# Patient Record
Sex: Male | Born: 1960 | Race: White | Hispanic: No | Marital: Married | Smoking: Never smoker
Health system: Southern US, Community
[De-identification: ages and names within clinical notes are randomized; demographics above are authoritative.]

---

## 2009-01-31 ENCOUNTER — Ambulatory Visit: Payer: Self-pay | Admitting: Sports Medicine

## 2009-01-31 DIAGNOSIS — M216X9 Other acquired deformities of unspecified foot: Secondary | ICD-10-CM

## 2009-01-31 DIAGNOSIS — M7750 Other enthesopathy of unspecified foot: Secondary | ICD-10-CM | POA: Insufficient documentation

## 2009-04-17 ENCOUNTER — Ambulatory Visit: Payer: Self-pay | Admitting: Sports Medicine

## 2009-04-17 DIAGNOSIS — M79609 Pain in unspecified limb: Secondary | ICD-10-CM

## 2009-11-20 ENCOUNTER — Ambulatory Visit: Payer: Self-pay | Admitting: Sports Medicine

## 2009-11-20 DIAGNOSIS — M25569 Pain in unspecified knee: Secondary | ICD-10-CM

## 2011-03-06 ENCOUNTER — Ambulatory Visit (INDEPENDENT_AMBULATORY_CARE_PROVIDER_SITE_OTHER): Payer: Managed Care, Other (non HMO) | Admitting: Sports Medicine

## 2011-03-06 ENCOUNTER — Encounter: Payer: Self-pay | Admitting: Sports Medicine

## 2011-03-06 VITALS — BP 98/66 | Ht 72.5 in | Wt 161.0 lb

## 2011-03-06 DIAGNOSIS — M25569 Pain in unspecified knee: Secondary | ICD-10-CM

## 2011-03-06 NOTE — Progress Notes (Signed)
  Subjective:    Patient ID: Nicholas Rhodes, male    DOB: 07/24/1961, 50 y.o.   MRN: [id number]  Knee Pain    50 yo M runner here for evaluation of Lt knee pain.  Ran marathon 3 days ago, last few miles began having some medial pain that felt mostly like a pressure.  Had similar pain when running a long run 2 weeks ago at the end of the run only, always able to finish runs as well as his marathon.  Denies awkward step or fall, did not feel pop, twist, or other mechanical symptoms.  No swelling.  He did put the green insoles for a couple of his longer runs and felt better.  Has prior orthotics, but not using anymore. Has h/o proximal tibial stress fx on Rt side, really wants to make sure not developing one on the Lt side. No night time pain.  Able to walk normally today without pain.  Really no pain at all today.   Review of Systems negative    Objective:   Physical Exam Gen: NAD Leg lengths: normal Feet: b/l mild pes cavus with broad forefoot.  Mild trans arch breakdown with some curling of 4/5th toes b/l.  MIld Morton's foot b/l.  No abnl callus.  Nl post tib strength. Lt knee: FROM, no effusion.  No joint line ttp.  No condylar ttp.  He points to area of pain at proximal tibial epicondyle region.  Neg patellar apprehension, grind, and inhibition.  Ligaments intact.  Neg McMurray's and Thessalys.  Quad and pat tendon intact.  No ttp over ITB/LFC.  Palpable but nontender medial Plica. Hips: FROM Negative hop test Gait: good form, mild out kick on Lt foot. Mid foot striker.  Level pelvis and hips.  No pronation.  MSK [place] Lt knee/tibia:  Mild cortical irregularity of proximal tibia on long and transv views with minimal periosteal doppler signal.  No gross cortical break or edema.  Med meniscus intact.  No effusion in suprapatellar pouch.       Assessment & Plan:

## 2011-03-06 NOTE — Assessment & Plan Note (Addendum)
This appears to be from increased impact, as he is fatiguing at the end of his runs likely leading poorer running form  - quad strengthening exercises - recommended trying his custom orthotics again, if uncomfortable he can RTC and we can modify - also recommend more cushion oriented shoes for his longer runs over lighter and more flexible shoes - reassured him at this time he has no evidence of true stress fracture, meniscal tear, etc. - f/u prn  Spent > 25 minutes with patient, >50% spent counseling on diagnosis, prognosis, and treatment as well as performing ultrasound

## 2012-01-01 ENCOUNTER — Encounter: Payer: Self-pay | Admitting: Sports Medicine

## 2012-01-01 ENCOUNTER — Ambulatory Visit (INDEPENDENT_AMBULATORY_CARE_PROVIDER_SITE_OTHER): Payer: Managed Care, Other (non HMO) | Admitting: Sports Medicine

## 2012-01-01 VITALS — BP 116/72 | HR 51 | Ht 72.0 in | Wt 160.0 lb

## 2012-01-01 DIAGNOSIS — M7710 Lateral epicondylitis, unspecified elbow: Secondary | ICD-10-CM

## 2012-01-01 DIAGNOSIS — M25529 Pain in unspecified elbow: Secondary | ICD-10-CM

## 2012-01-01 MED ORDER — AMITRIPTYLINE HCL 10 MG PO TABS: 10.0000 mg | ORAL_TABLET | Freq: Every day | ORAL | Status: DC

## 2012-01-01 MED ORDER — KETOPROFEN POWD: Status: DC

## 2012-01-01 NOTE — Assessment & Plan Note (Addendum)
Pain for nearly two months, consistent with lateral epicondylitis.  However, pain more severe than expected without tendon tear, concern for some nerve irritation.  Advised no ice because of concern for nerve pain. Also Rx for amitriptyline qhs because of this concern.

## 2012-01-01 NOTE — Assessment & Plan Note (Addendum)
Rx for ketoprofen powder, home exercises given.  Also fitted for elbow sleeve.  Follow up in 1 month.   This injury is suspicious for some nerve irritation because of the pain he experiences at nighttime. For this reason we will use amitriptyline at night along with the standard treatments for tennis elbow.

## 2012-01-01 NOTE — Patient Instructions (Signed)
Please use the ketoprofen gel on your elbow four times a day.  Also, try taking amitriptyline at bed time to help calm down nerve pain.  Start doing the home exercises in the attached sheet.  Start with one lb weights, please try to do 3 sets of 15 and increase weight as tolerated.

## 2012-01-01 NOTE — Progress Notes (Signed)
  Subjective:    Patient ID: Nicholas Rhodes, male    DOB: 12/23/1960, 51 y.o.   MRN: [id number]  HPI  Nicholas Rhodes comes in for right elbow pain.  He says it started the day after Thanksgiving when he chopped some wood and then stacked it into piles.  He says about a week after that he was using a hammer and found it was extremely painful.  It has gotten worse, and now it hurts him at night and sometimes keeps him up.  He says he has had tennis elbow before, but never this bad.  He has even moved his mouse to the left side (he is right handed) because it hurts so badly.    Review of Systems Pertinent Items noted in HPI.     Objective:   Physical Exam BP 116/72  Pulse 51  Ht 6' (1.829 m)  Wt 160 lb (72.576 kg)  BMI 21.70 kg/m2 General appearance: alert, cooperative and no distress Right Elbow: tenderness to palpation over lateral epicondyle Pain with wrist flexion, pain with supination No swelling or abnormality  MSK [place]: Left common epicondyle tendon in tact.  There is hypoechogenic area around lateral epicondyle.  There is a small avulsion on the lateral epicondyle. No abnormal Doppler flow.       Assessment & Plan:

## 2013-04-08 ENCOUNTER — Encounter: Payer: Self-pay | Admitting: Sports Medicine

## 2013-04-08 ENCOUNTER — Ambulatory Visit (INDEPENDENT_AMBULATORY_CARE_PROVIDER_SITE_OTHER): Payer: Managed Care, Other (non HMO) | Admitting: Sports Medicine

## 2013-04-08 VITALS — BP 111/64 | HR 55 | Ht 72.0 in | Wt 160.0 lb

## 2013-04-08 DIAGNOSIS — M7750 Other enthesopathy of unspecified foot: Secondary | ICD-10-CM

## 2013-04-08 DIAGNOSIS — M79609 Pain in unspecified limb: Secondary | ICD-10-CM

## 2013-04-08 DIAGNOSIS — M21619 Bunion of unspecified foot: Secondary | ICD-10-CM

## 2013-04-08 DIAGNOSIS — M2011 Hallux valgus (acquired), right foot: Secondary | ICD-10-CM

## 2013-04-08 NOTE — Progress Notes (Signed)
  Subjective:    Patient ID: Nicholas Rhodes, male    DOB: 11-13-1961, 52 y.o.   MRN: [id number]  HPI  Pt presents to clinic for evaluation of rt forefoot pain between 2-3 toes for 3 months.  Feels like a buldge when it is inflamed.  Long time runner. Running in [employer]a shoes, this feels ok. Feels uncomfortable in dress shoes, or shoes with thinner sole.     Review of Systems     Objective:   Physical Exam  Rt foot: Puffiness distal to MT heads 3-4  Transverse arch wider than lt Early bunion change Splaying between toes 2-3 bunionette on rt  Rt forefoot 12 cm wide Lt forefoot 11 cm wide  Slight bunionette on lt      Assessment & Plan:

## 2013-04-13 DIAGNOSIS — M21619 Bunion of unspecified foot: Secondary | ICD-10-CM | POA: Insufficient documentation

## 2013-04-13 NOTE — Assessment & Plan Note (Signed)
Now with more breakdown on RT  He ahs bilateral forefoot changes  Work with MT padding and see if this will help  RT with old custom orthotics and let's see if we can correct these

## 2013-04-13 NOTE — Assessment & Plan Note (Signed)
New MT pads added Position corrected  Patient had good relief of pain

## 2013-04-14 ENCOUNTER — Ambulatory Visit: Payer: Managed Care, Other (non HMO) | Admitting: Sports Medicine

## 2015-01-24 ENCOUNTER — Encounter: Payer: Self-pay | Admitting: Sports Medicine

## 2015-01-24 ENCOUNTER — Ambulatory Visit
Admission: RE | Admit: 2015-01-24 | Discharge: 2015-01-24 | Disposition: A | Discharge status: Home / Self Care | Payer: BLUE CROSS/BLUE SHIELD | Source: Ambulatory Visit | Attending: Sports Medicine | Admitting: Sports Medicine

## 2015-01-24 ENCOUNTER — Ambulatory Visit (INDEPENDENT_AMBULATORY_CARE_PROVIDER_SITE_OTHER): Payer: BLUE CROSS/BLUE SHIELD | Admitting: Sports Medicine

## 2015-01-24 VITALS — BP 129/70 | HR 71 | Ht 72.0 in | Wt 165.0 lb

## 2015-01-24 DIAGNOSIS — M869 Osteomyelitis, unspecified: Secondary | ICD-10-CM | POA: Diagnosis not present

## 2015-01-24 NOTE — Patient Instructions (Addendum)
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  Hold off on long runs for now. We are getting an xray to further assess your pelvis. Take aleve 220mg  tablets - take 2 tablets twice daily with food for 10 days. Do exercises discussed.    Do 3 sets of 30 without discomfort prior to adding 5 lb weight (slowly build up on the weight). Ease back into running after 2 weeks of doing these exercises. Follow up in 6 weeks.
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
## 2015-03-07 ENCOUNTER — Ambulatory Visit (INDEPENDENT_AMBULATORY_CARE_PROVIDER_SITE_OTHER): Payer: BLUE CROSS/BLUE SHIELD | Admitting: Sports Medicine

## 2015-03-07 ENCOUNTER — Encounter: Payer: Self-pay | Admitting: Sports Medicine

## 2015-03-07 VITALS — BP 115/68 | Ht 72.0 in | Wt 165.0 lb

## 2015-03-07 DIAGNOSIS — M25551 Pain in right hip: Secondary | ICD-10-CM

## 2015-03-07 NOTE — Progress Notes (Signed)
   HPI:  F/u groin pain - had xrays which showed small chip at lateral superior edge of acetabulum. Has been biking and swimming. Doing low mileage runs (15 mi/week total, no more than 6 mi at at time). He now has no pain, but feels tightness on the inside of each thigh after running. He has not been icing or doing any more stretching or yoga. He briefly did the exercises given to him at his last visit. He's taken Aleve as needed with some relief. [place] and [place]  ROS: See HPI  PMFSH: hx multiple occasional MSK issues otherwise healthy  PHYSICAL EXAM: BP 115/68 mmHg  Ht 6' (1.829 m)  Wt 165 lb (74.844 kg)  BMI 22.37 kg/m2 Gen: NAD, pleasant, cooperative MSK:  Full range of motion with internal and external rotation bilateral hips. Full strength with hip abductors bilaterally. Right hip adductor is weak, also left hip adductor is slightly weak. Full strength with hip flexion bilaterally.  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 Rather, seemed to have pulled a slight chip off of his acetabulum. Also injured the lateral greater trochanter. He's doing well. We've given him exercises for strengthening his hip adductors. These include inside lifts, outside lifts, and standing rotations. He will add 1-2 miles to one per week to gradually increase his mileage. F/u prn.     FOLLOW UP: F/u as needed if symptoms worsen or do not improve.   SIGNED: Estevan RyderBrittany J. Pollie MeyerMcIntyre, MD Family Medicine Resident PGY-3 [facility]   Agree with assessment.  Sterling BigKB Fields, MD

## 2015-03-07 NOTE — Patient Instructions (Signed)
Add 1-2 miles at a time to one run per week Do inside lifts, outside lifts, standing hip rotation Do these until your strength feels better then do them 3 times per week
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
## 2015-04-18 ENCOUNTER — Encounter: Payer: Self-pay | Admitting: Sports Medicine

## 2015-04-18 ENCOUNTER — Ambulatory Visit (INDEPENDENT_AMBULATORY_CARE_PROVIDER_SITE_OTHER): Payer: BLUE CROSS/BLUE SHIELD | Admitting: Sports Medicine

## 2015-04-18 VITALS — BP 86/32 | Ht 72.0 in | Wt 165.0 lb

## 2015-04-18 DIAGNOSIS — M869 Osteomyelitis, unspecified: Secondary | ICD-10-CM

## 2015-04-18 DIAGNOSIS — M6580 Other synovitis and tenosynovitis, unspecified site: Secondary | ICD-10-CM

## 2015-04-18 DIAGNOSIS — M76899 Other specified enthesopathies of unspecified lower limb, excluding foot: Secondary | ICD-10-CM | POA: Insufficient documentation

## 2015-04-18 MED ORDER — NITROGLYCERIN 0.2 MG/HR TD PT24: MEDICATED_PATCH | TRANSDERMAL | Status: DC

## 2015-04-18 NOTE — Patient Instructions (Signed)
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
## 2015-04-18 NOTE — Assessment & Plan Note (Signed)
I advised him that one of the biggest issues with this takes a long time to heal  We will continue to work with hip range of motion and strength exercises  He can continue sports that do not aggravate this

## 2015-04-25 ENCOUNTER — Ambulatory Visit: Payer: BLUE CROSS/BLUE SHIELD | Admitting: Sports Medicine

## 2015-06-20 ENCOUNTER — Encounter: Payer: Self-pay | Admitting: Sports Medicine

## 2015-06-20 ENCOUNTER — Ambulatory Visit (INDEPENDENT_AMBULATORY_CARE_PROVIDER_SITE_OTHER): Payer: BLUE CROSS/BLUE SHIELD | Admitting: Sports Medicine

## 2015-06-20 VITALS — BP 134/66 | Ht 72.0 in | Wt 166.2 lb

## 2015-06-20 DIAGNOSIS — M6580 Other synovitis and tenosynovitis, unspecified site: Secondary | ICD-10-CM

## 2015-06-20 DIAGNOSIS — M869 Osteomyelitis, unspecified: Secondary | ICD-10-CM

## 2015-06-20 DIAGNOSIS — M76899 Other specified enthesopathies of unspecified lower limb, excluding foot: Secondary | ICD-10-CM

## 2015-06-20 NOTE — Assessment & Plan Note (Signed)
I advised him that one of the biggest issues with this takes a long time to heal.  Will start isolated PT to stabilize pelvis and work on adductors.  He can continue sports that do not aggravate this.  If pain not improved by September, consider steroid injection at pubis before iron man end of September
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
## 2015-06-20 NOTE — Assessment & Plan Note (Addendum)
D/C topical nitroglycerin as it does not seem to be improving symptoms and is causing headaches.  Continue using body helix with exercise.  Will start isolated PT to stabilize pelvis and work on adductors. Continue home exercise regimen.  Likely compensation from osteitis pubis.

## 2015-07-07 ENCOUNTER — Ambulatory Visit: Payer: BLUE CROSS/BLUE SHIELD | Attending: Sports Medicine | Admitting: Physical Therapy

## 2015-07-07 DIAGNOSIS — M6289 Other specified disorders of muscle: Secondary | ICD-10-CM | POA: Diagnosis present

## 2015-07-07 DIAGNOSIS — R29898 Other symptoms and signs involving the musculoskeletal system: Secondary | ICD-10-CM

## 2015-07-07 DIAGNOSIS — R1031 Right lower quadrant pain: Secondary | ICD-10-CM

## 2015-07-07 DIAGNOSIS — R293 Abnormal posture: Secondary | ICD-10-CM

## 2015-07-07 DIAGNOSIS — R1032 Left lower quadrant pain: Secondary | ICD-10-CM | POA: Diagnosis present
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
## 2015-07-11 ENCOUNTER — Ambulatory Visit: Payer: BLUE CROSS/BLUE SHIELD | Admitting: Physical Therapy

## 2015-07-11 DIAGNOSIS — R29898 Other symptoms and signs involving the musculoskeletal system: Secondary | ICD-10-CM

## 2015-07-11 DIAGNOSIS — R293 Abnormal posture: Secondary | ICD-10-CM

## 2015-07-11 DIAGNOSIS — R1032 Left lower quadrant pain: Secondary | ICD-10-CM

## 2015-07-11 DIAGNOSIS — M6289 Other specified disorders of muscle: Secondary | ICD-10-CM | POA: Diagnosis not present

## 2015-07-11 DIAGNOSIS — R1031 Right lower quadrant pain: Secondary | ICD-10-CM

## 2015-07-12 NOTE — Therapy (Signed)
[facility] [address] [place], [place], [address] Phone: [phone]   Fax:  [phone]  Physical Therapy Treatment  Patient Details  Name: Nicholas Rhodes MRN: [id number] Date of Birth: [birth year] Referring Provider:  Elias Else, MD  Encounter Date: 07/11/2015      PT End of Session - 07/11/15 1611    Visit Number 2   Number of Visits 12   Date for PT Re-Evaluation 08/31/15   PT Start Time 1550   PT Stop Time 1630   PT Time Calculation (min) 40 min   Activity Tolerance Patient tolerated treatment well   Behavior During Therapy [facility] for tasks assessed/performed      No past medical history on file.  No past surgical history on file.  There were no vitals filed for this visit.  Visit Diagnosis:  Weakness of both hips  Abnormal posture  Pain in the groin, left  Pain in the groin, right      Subjective Assessment - 07/11/15 1615    Subjective No c/o pain today.  He is fatigued from a 80 mile bike ride this weekend.  Did not do the exercises.     Currently in Pain? No/denies            [facility] Adult PT Treatment/Exercise - 07/11/15 1553    Exercises   Exercises Lumbar;Knee/Hip;Other Exercises   Other Exercises  seated piriformis stretch each leg 30 second hold   Lumbar Exercises: Stretches   Active Hamstring Stretch 1 rep;30 seconds   Hip Flexor Stretch 1 rep;60 seconds;Other (comment)   Hip Flexor Stretch Limitations completed in supine in thomas test position, L   Pelvic Tilt 5 reps;10 seconds   ITB Stretch 1 rep;30 seconds   Lumbar Exercises: Supine   Bridge 5 reps;5 seconds;Other (comment)  w/ ball squeezes between knees x5, SL bridge x10/leg   Bridge Limitations constant verbal cues needed for correct form and body alignment  cues for articulating spine, releasing hip flexors.    Lumbar Exercises: Sidelying   Clam 20 reps   Hip Abduction 15 reps   Other Sidelying Lumbar Exercises hip  adduction x15reps   Knee/Hip Exercises: Standing   Hip Abduction AROM;Stengthening;Both;1 set;10 reps;Knee straight                PT Education - 07/11/15 1634    Education provided Yes   Education Details HEP form and body alignment and awareness in space   Person(s) Educated Patient   Methods Explanation;Tactile cues;Verbal cues   Comprehension Returned demonstration          PT Short Term Goals - 07/11/15 1612    PT SHORT TERM GOAL #1   Title Patient will be I with initial HEP   Time 2   Status On-going   PT SHORT TERM GOAL #2   Title Pt will be able to demo glute/hip ext strength to 4+/5    Time 4   Status On-going   PT SHORT TERM GOAL #3   Title Pt will be able to to walk uphill without increased pain    Time 4   Status On-going   PT SHORT TERM GOAL #4   Title Pt will be able to run up to 10 miles with only min fatigue/pulling in ant hip/groin   Time 4   Status On-going           PT Long Term Goals - 07/11/15 1613    PT LONG TERM GOAL #  1   Title Pt will be I with more advanced HEP and be consistent.    Time 8   Period Weeks   Status On-going   PT LONG TERM GOAL #2   Title Pt will complete training for Iron Man and report min tightness/pain after running    Status On-going   PT LONG TERM GOAL #3   Title Patient will be able to perform double leg lower exercise to 45 deg and maintain lumbar/pelvic neutral (head up if needed)   Status On-going   PT LONG TERM GOAL #4   Title Pt will demo 5/5 hamstring, glute med and abd/adductor strength bilaterally for optimal running mechanics   Status On-going               Plan - 07/12/15 9563    Clinical Impression Statement Patient tolerated ex well, no increased pain. Apparent weakness in hip abd and hip adduction with sidelying mat ex.  No Trendelenburg sign. Patient needs thoracic/hip extension exercises to reverse biking posture but with core control.      PT Next Visit Plan review HEP and  progress, may not be seen until after his 1/2 Iron man.  Begin pilates.    PT Home Exercise Plan given HEP: single leg bridge, hip flex stretch, lower ab table top, hip add and glute med   Consulted and Agree with Plan of Care Patient        Problem List Patient Active Problem List   Diagnosis Date Noted  . Adductor tendinitis 04/18/2015  . 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 Bunion of great toe 04/13/2013  . Elbow pain 01/01/2012  . Lateral epicondylitis (tennis elbow) 01/01/2012  . KNEE PAIN, LEFT 11/20/2009  . FOOT PAIN, BILATERAL 04/17/2009  . METATARSALGIA 01/31/2009  . 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 [place], [place], [address] Phone: [phone]   Fax:  [phone]    Karie Mainland, PT 07/12/2015 8:29 AM Phone: [phone] Fax: [phone]

## 2015-07-25 ENCOUNTER — Ambulatory Visit: Payer: BLUE CROSS/BLUE SHIELD | Attending: Sports Medicine | Admitting: Physical Therapy

## 2015-07-25 DIAGNOSIS — M6289 Other specified disorders of muscle: Secondary | ICD-10-CM | POA: Diagnosis not present

## 2015-07-25 DIAGNOSIS — R1031 Right lower quadrant pain: Secondary | ICD-10-CM

## 2015-07-25 DIAGNOSIS — R1032 Left lower quadrant pain: Secondary | ICD-10-CM

## 2015-07-25 DIAGNOSIS — R293 Abnormal posture: Secondary | ICD-10-CM | POA: Diagnosis present

## 2015-07-25 DIAGNOSIS — R29898 Other symptoms and signs involving the musculoskeletal system: Secondary | ICD-10-CM

## 2015-07-25 NOTE — Therapy (Signed)
[place], [place], [address] Phone: [phone]   Fax:  [phone]  Physical Therapy Treatment  Patient Details  Name: Nicholas Rhodes MRN: [id number] Date of Birth: 1961-01-31 Referring Provider:  Stefanie Libel, MD  Encounter Date: 07/25/2015      PT End of Session - 07/25/15 0901    Visit Number 3   Number of Visits 12   Date for PT Re-Evaluation 08/31/15   PT Start Time 0800   PT Stop Time 0900   PT Time Calculation (min) 60 min   Activity Tolerance Patient tolerated treatment well   Behavior During Therapy [facility] for tasks assessed/performed      No past medical history on file.  No past surgical history on file.  There were no vitals filed for this visit.  Visit Diagnosis:  Weakness of both hips  Abnormal posture  Pain in the groin, left  Pain in the groin, right      Subjective Assessment - 07/25/15 0859    Subjective No c/o pain, was able to do 1/2 Iron Man, felt no pain switching from bike to run (as he usually does). Pain overal min, did not finish full run (did 6.5 miles).  Describes his symptoms as more weakness than pain.  "Glued to the road"/       Pilates Reformer used for LE/core strength, postural strength, lumbopelvic disassociation and core control.  Exercises included: Footwork 2 Red 1 Green x 10 parallel heels, x 10 parallel toes: good pelvic stability here Bridging articulating x 10 stiffness mid to low thoracic, noted by pt as well.  Supine Arms 1 Red 1 yellow: Arcs x 8 parallel, T x 8 SLOW pace (mild strain ant hips with table top) Feet in Straps 1 Red 1 Yellow: Arcs x 10, Circles x 6 each direction (strain with adduction) Supine single leg stretching: 1 Red for hamstring, ITB, adductors, anterior hip (opposite foot in the well)  Done carefully to ensure safety and with PT assist.  Bilateral. Scooter 1 Red for standing, balance, 1 Hand hold, added lunge and Eve's  lunge for anterior hip stretching.  Pt able to do a post tilt and get effective stretch without hyperextending lumbar spine.    Self care: Ice pack, inflammation, ligament pain vs muscle pain, core strength to improve propulsion on the road, pelvic stability.       [place] Adult PT Treatment/Exercise - 07/25/15 0906    Modalities   Modalities Cryotherapy   Cryotherapy   Number Minutes Cryotherapy 10 Minutes   Cryotherapy Location --  bilateral anterior hips   Type of Cryotherapy Ice pack                PT Education - 07/25/15 0901    Education provided Yes   Education Details ice post workout   Person(s) Educated Patient   Methods Explanation   Comprehension Verbalized understanding          PT Short Term Goals - 07/11/15 1612    PT SHORT TERM GOAL #1   Title Patient will be I with initial HEP   Time 2   Status On-going   PT SHORT TERM GOAL #2   Title Pt will be able to demo glute/hip ext strength to 4+/5    Time 4   Status On-going   PT SHORT TERM GOAL #3   Title Pt will be able to to walk uphill without increased pain    Time 4  Status On-going   PT SHORT TERM GOAL #4   Title Pt will be able to run up to 10 miles with only min fatigue/pulling in ant hip/groin   Time 4   Status On-going           PT Long Term Goals - 07/11/15 1613    PT LONG TERM GOAL #1   Title Pt will be I with more advanced HEP and be consistent.    Time 8   Period Weeks   Status On-going   PT LONG TERM GOAL #2   Title Pt will complete training for Iron Man and report min tightness/pain after running    Status On-going   PT LONG TERM GOAL #3   Title Patient will be able to perform double leg lower exercise to 45 deg and maintain lumbar/pelvic neutral (head up if needed)   Status On-going   PT LONG TERM GOAL #4   Title Pt will demo 5/5 hamstring, glute med and abd/adductor strength bilaterally for optimal running mechanics   Status On-going               Plan -  07/25/15 0901    Clinical Impression Statement Patient tolerated basic Pilates Reformer ex well, notable pain/discomfort with hip abd/adduction stretching. He is working towards a full Iron Man in Sept.  He was advised to be consistent with HEP and use RICE regardless of pain level.    PT Next Visit Plan check bony landmarks for symmetry- MET? review HEP and progress hip/pelvic stab.  Begin pilates.    PT Home Exercise Plan given HEP: single leg bridge, hip flex stretch, lower ab table top, hip add and glute med   Consulted and Agree with Plan of Care Patient        Problem List Patient Active Problem List   Diagnosis Date Noted  . Adductor tendinitis 04/18/2015  . 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 Bunion of great toe 04/13/2013  . Elbow pain 01/01/2012  . Lateral epicondylitis (tennis elbow) 01/01/2012  . KNEE PAIN, LEFT 11/20/2009  . FOOT PAIN, BILATERAL 04/17/2009  . METATARSALGIA 01/31/2009  . 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 [place], [place], [address] Phone: [phone]   Fax:  [phone]   Raeford Razor, PT 07/25/2015 9:22 AM Phone: [phone] Fax: [phone]

## 2015-07-28 ENCOUNTER — Ambulatory Visit: Payer: BLUE CROSS/BLUE SHIELD | Admitting: Physical Therapy

## 2015-07-28 DIAGNOSIS — R29898 Other symptoms and signs involving the musculoskeletal system: Secondary | ICD-10-CM

## 2015-07-28 DIAGNOSIS — R1032 Left lower quadrant pain: Secondary | ICD-10-CM

## 2015-07-28 DIAGNOSIS — R1031 Right lower quadrant pain: Secondary | ICD-10-CM

## 2015-07-28 DIAGNOSIS — R293 Abnormal posture: Secondary | ICD-10-CM

## 2015-07-28 DIAGNOSIS — M6289 Other specified disorders of muscle: Secondary | ICD-10-CM | POA: Diagnosis not present

## 2015-07-28 NOTE — Therapy (Signed)
[place], [place], [address] Phone: [phone]   Fax:  [phone]  Physical Therapy Treatment  Patient Details  Name: MCCOY TESTA MRN: [id number] Date of Birth: Dec 22, 1960 Referring Provider:  Maury Dus, MD  Encounter Date: 07/28/2015      PT End of Session - 07/28/15 0809    Visit Number 4   Number of Visits 12   Date for PT Re-Evaluation 08/31/15   PT Start Time 0800   PT Stop Time 0850   PT Time Calculation (min) 50 min   Activity Tolerance Patient tolerated treatment well   Behavior During Therapy [facility] for tasks assessed/performed      No past medical history on file.  No past surgical history on file.  There were no vitals filed for this visit.  Visit Diagnosis:  Weakness of both hips  Abnormal posture  Pain in the groin, left  Pain in the groin, right      Subjective Assessment - 07/28/15 0802    Subjective Pt felt great Tuesday (the best Ive felt after running in a long time).  Ran 9 miles this am.  The last 800 m I was really fatigued going uphill.  Discomfort but no pain.     Currently in Pain? No/denies            [facility] PT Assessment - 07/28/15 3149    Posture/Postural Control   Posture Comments Noted L post rotation of ilium            PT Education - 07/28/15 0914    Education provided Yes   Education Details HEP    Person(s) Educated Patient   Methods Explanation;Demonstration;Handout   Comprehension Verbalized understanding;Returned demonstration         [facility] Adult PT Treatment/Exercise - 07/28/15 0808    Self-Care   Self-Care Posture;Other Self-Care Comments   Knee/Hip Exercises: Supine   Bridges with Clamshell Strengthening;Both;2 sets;20 reps;Other (comment)  2 sets, 1 et with blue band   Knee/Hip Exercises: Sidelying   Clams x 20 each leg with band   Manual Therapy   Manual Therapy Muscle Energy Technique   Manual therapy  comments resisted L hip flexion x5, 10 sec hold    Self care included form for exercises, new exercises HEP and integrating core work into routine.   Pilates Reformer used for LE/core strength, postural strength, lumbopelvic disassociation and core control.  Exercises included: Reverse Abdominals 1 red, UE x 8, LE x 10 added oblique x 5 each Sidelying footwork 2 Red x 10 heel parallel, x 10 heel turnout, x 10 toes turnout Single leg straps 1 Red , sidekicks x 10, glute lift x 8   Increased fatigue and patient needs mod cues to avoid lumbar flexion with hip flexion.  Supine Arm/Ab  1 red 1 blue arcs and added chest lift (legs in table top) x 10 each  Feet in Straps Arcs x 10 parallel, x 10 turnout, repeated with magic circle parallel and turnout x 10 each         PT Short Term Goals - 07/28/15 0917    PT SHORT TERM GOAL #1   Title Patient will be I with initial HEP   Status Achieved   PT SHORT TERM GOAL #2   Title Pt will be able to demo glute/hip ext strength to 4+/5    Status On-going   PT SHORT TERM GOAL #3   Title Pt will be able to  to walk uphill without increased pain    Status On-going   PT SHORT TERM GOAL #4   Title Pt will be able to run up to 10 miles with only min fatigue/pulling in ant hip/groin   Status On-going           PT Long Term Goals - 07/28/15 0917    PT LONG TERM GOAL #1   Title Pt will be I with more advanced HEP and be consistent.    Status On-going   PT LONG TERM GOAL #2   Title Pt will complete training for Iron Man and report min tightness/pain after running    Status On-going   PT LONG TERM GOAL #3   Title Patient will be able to perform double leg lower exercise to 45 deg and maintain lumbar/pelvic neutral (head up if needed)   Status On-going   PT LONG TERM GOAL #4   Title Pt will demo 5/5 hamstring, glute med and abd/adductor strength bilaterally for optimal running mechanics   Status On-going               Plan - 07/28/15 0914     Clinical Impression Statement Patient with L post rotation of ilium, gave him MET for home to improve alignment.  He demonstrated hip weakness today in sidelying Reformer exercise progression, possibly due to longer run this AM.  Met goals for HEP, discomfort with running was moderate.     PT Next Visit Plan check L post rot.  and MET, progress lower ab/core, try foam roller for stab.  and Pilates, dry needling upcoming 8/26   PT Home Exercise Plan given HEP: single leg bridge, hip flex stretch, lower ab table top, hip add and glute med, also gave clam and bridge with blue band, MET: resisted hip flexion L   Consulted and Agree with Plan of Care Patient        Problem List Patient Active Problem List   Diagnosis Date Noted  . Adductor tendinitis 04/18/2015  . 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 Bunion of great toe 04/13/2013  . Elbow pain 01/01/2012  . Lateral epicondylitis (tennis elbow) 01/01/2012  . KNEE PAIN, LEFT 11/20/2009  . FOOT PAIN, BILATERAL 04/17/2009  . METATARSALGIA 01/31/2009  . 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 [place], [place], [address] Phone: [phone]   Fax:  [phone]    Raeford Razor, PT 07/28/2015 9:32 AM Phone: [phone] Fax: [phone]

## 2015-07-31 ENCOUNTER — Encounter: Payer: Self-pay | Admitting: Physical Therapy

## 2015-07-31 ENCOUNTER — Ambulatory Visit: Payer: BLUE CROSS/BLUE SHIELD | Admitting: Physical Therapy

## 2015-07-31 DIAGNOSIS — M6289 Other specified disorders of muscle: Secondary | ICD-10-CM | POA: Diagnosis not present

## 2015-07-31 DIAGNOSIS — R1032 Left lower quadrant pain: Secondary | ICD-10-CM

## 2015-07-31 DIAGNOSIS — R1031 Right lower quadrant pain: Secondary | ICD-10-CM

## 2015-07-31 DIAGNOSIS — R293 Abnormal posture: Secondary | ICD-10-CM

## 2015-07-31 DIAGNOSIS — R29898 Other symptoms and signs involving the musculoskeletal system: Secondary | ICD-10-CM

## 2015-07-31 NOTE — Therapy (Signed)
[place], [place], [address] Phone: [phone]   Fax:  [phone]  Physical Therapy Treatment  Patient Details  Name: Nicholas Rhodes MRN: [id number] Date of Birth: [birth year] Referring Provider:  Maury Dus, MD  Encounter Date: 07/31/2015      PT End of Session - 07/31/15 0809    Visit Number 5   Number of Visits 12   PT Start Time 0803   PT Stop Time 0848   PT Time Calculation (min) 45 min   Activity Tolerance Patient tolerated treatment well   Behavior During Therapy [facility] for tasks assessed/performed      History reviewed. No pertinent past medical history.  History reviewed. No pertinent past surgical history.  There were no vitals filed for this visit.  Visit Diagnosis:  Weakness of both hips  Abnormal posture  Pain in the groin, left  Pain in the groin, right      Subjective Assessment - 07/31/15 0808    Subjective Just drove back from [place].  Did not do any exercise yesterday. No c/o   Currently in Pain? No/denies                         [facility] Adult PT Treatment/Exercise - 07/31/15 0816    Lumbar Exercises: Stretches   Hip Flexor Stretch 2 reps;30 seconds   Hip Flexor Stretch Limitations on foam roller   Lumbar Exercises: Machines for Strengthening   Other Lumbar Machine Exercise Reformer ex see note   Lumbar Exercises: Supine   Bridge --  x 10 articulating, added clam with blue band stability ex   Bridge Limitations needs cues to decrease rib flare and post tilt pelvis   Other Supine Lumbar Exercises foam roller ex with blue band horiz abd   diagonal pull blue band x 12 each, UE and LE ext opp direct.   Other Supine Lumbar Exercises UE/LE lift and dead bug  x 12 each ex, noted mild instabiity, cues to maintain neutra   Lumbar Exercises: Sidelying   Clam 20 reps   Clam Limitations blue band x 20 each       Pilates Reformer used for LE/core  strength, postural strength, lumbopelvic disassociation and core control.  Exercises included:  Upstretch (plank) 1 red: maintained plank and then added shoulder extension x 8 for core.  Min cues for head neck and shoulder alignment and maintaining neutral lumbar. Long box Prone pulling straps 1 red shoulder extension and T lift x 8-10 Glute press on short box 1 red with foot on footbar. Pt noted to shift hips too far laterally when extending hip/knee.  When pushing with LLE, L lumbar rotated to Rt., and when he corrected he felt strain in [place]. anterior hip, groin .  Explained compensatory pattern and the need for this proprioceptive training to correct.     Declined ice.       PT Education - 07/31/15 0955    Education provided Yes   Education Details bridging and articulation, thoracic spine/rib connection   Person(s) Educated Patient   Methods Explanation;Tactile cues;Demonstration   Comprehension Verbalized understanding;Tactile cues required;Verbal cues required          PT Short Term Goals - 07/28/15 0917    PT SHORT TERM GOAL #1   Title Patient will be I with initial HEP   Status Achieved   PT SHORT TERM GOAL #2   Title Pt will be able to  demo glute/hip ext strength to 4+/5    Status On-going   PT SHORT TERM GOAL #3   Title Pt will be able to to walk uphill without increased pain    Status On-going   PT SHORT TERM GOAL #4   Title Pt will be able to run up to 10 miles with only min fatigue/pulling in ant hip/groin   Status On-going           PT Long Term Goals - 07/28/15 0917    PT LONG TERM GOAL #1   Title Pt will be I with more advanced HEP and be consistent.    Status On-going   PT LONG TERM GOAL #2   Title Pt will complete training for Iron Man and report min tightness/pain after running    Status On-going   PT LONG TERM GOAL #3   Title Patient will be able to perform double leg lower exercise to 45 deg and maintain lumbar/pelvic neutral (head up if needed)    Status On-going   PT LONG TERM GOAL #4   Title Pt will demo 5/5 hamstring, glute med and abd/adductor strength bilaterally for optimal running mechanics   Status On-going               Plan - 07/31/15 0956    Clinical Impression Statement Patient with symmetrical pelvic alignment in standing today.  He does report improvement in discomfort while running.  Pt with typical posture of prolonged bicycle riding which will continue to benefit from corrective Pilates based exercise. Reports less discomfort in low abs/groing since beginning PT.    PT Next Visit Plan check goals, MMT? check L post rot.  and MET, progress lower ab/core, try foam roller for stab.  and Pilates, dry needling upcoming 8/26   PT Home Exercise Plan given HEP: single leg bridge, hip flex stretch, lower ab table top, hip add and glute med, also gave clam and bridge with blue band, MET: resisted hip flexion L   Consulted and Agree with Plan of Care Patient        Problem List Patient Active Problem List   Diagnosis Date Noted  . Adductor tendinitis 04/18/2015  . 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 Bunion of great toe 04/13/2013  . Elbow pain 01/01/2012  . Lateral epicondylitis (tennis elbow) 01/01/2012  . KNEE PAIN, LEFT 11/20/2009  . FOOT PAIN, BILATERAL 04/17/2009  . METATARSALGIA 01/31/2009  . 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 [place], [place], [address] Phone: [phone]   Fax:  [phone]   Raeford Razor, PT 07/31/2015 10:08 AM Phone: [phone] Fax: [phone]

## 2015-08-01 ENCOUNTER — Ambulatory Visit: Payer: BLUE CROSS/BLUE SHIELD | Admitting: Physical Therapy

## 2015-08-01 DIAGNOSIS — R293 Abnormal posture: Secondary | ICD-10-CM

## 2015-08-01 DIAGNOSIS — R1031 Right lower quadrant pain: Secondary | ICD-10-CM

## 2015-08-01 DIAGNOSIS — R1032 Left lower quadrant pain: Secondary | ICD-10-CM

## 2015-08-01 DIAGNOSIS — M6289 Other specified disorders of muscle: Secondary | ICD-10-CM | POA: Diagnosis not present

## 2015-08-01 DIAGNOSIS — R29898 Other symptoms and signs involving the musculoskeletal system: Secondary | ICD-10-CM

## 2015-08-01 NOTE — Therapy (Signed)
[place], [place], [address] Phone: [phone]   Fax:  [phone]  Physical Therapy Treatment  Patient Details  Name: Nicholas Rhodes MRN: [id number] Date of Birth: Mar 11, 1961 Referring Provider:  Maury Dus, MD  Encounter Date: 08/01/2015      PT End of Session - 08/01/15 1356    Visit Number 6   Number of Visits 12   Date for PT Re-Evaluation 08/31/15   PT Start Time 7510   PT Stop Time 1102   PT Time Calculation (min) 44 min   Activity Tolerance Patient tolerated treatment well   Behavior During Therapy [facility] for tasks assessed/performed      No past medical history on file.  No past surgical history on file.  There were no vitals filed for this visit.  Visit Diagnosis:  Weakness of both hips  Abnormal posture  Pain in the groin, left  Pain in the groin, right      Subjective Assessment - 07/31/15 0808    Subjective Just drove back from [place].  Did not do any exercise yesterday. No c/o   Currently in Pain? No/denies            [facility] PT Assessment - 08/01/15 1037    Strength   Right Hip Extension 4+/5   Right Hip ABduction 5/5  glute med 3/5   Left Hip Extension 4/5  pain anteriorly across both ASIS   Left Hip ABduction 4+/5  glute med 3+/5           [facility] Adult PT Treatment/Exercise - 08/01/15 1038    Lumbar Exercises: Prone   Straight Leg Raise --   Lumbar Exercises: Quadruped   Opposite Arm/Leg Raise Right arm/Left leg;Left arm/Right leg;10 reps;5 seconds   Knee/Hip Exercises: Stretches   Nutritional therapist Limitations 30 sec with strap   Other Knee/Hip Stretches pain in Rt. ant hip with L psoas stretching   Knee/Hip Exercises: Standing   Step Down 2 sets;20 reps;Hand Hold: 0;Step Height: 4"   Step Down Limitations emphasizing pelvis and knee alignment (used mirror for visual cues) Pt tends to adduct hip but can correct   Other Standing  Knee Exercises hip hinge (single leg dead lift) with pole to balance   x 10-15 reps, glute work/discomfort, min cues for alignment   Knee/Hip Exercises: Sidelying   Hip ABduction Strengthening;Both;1 set;10 reps   Hip ABduction Limitations also did glute med lifts x 10 each LE very challenging    Knee/Hip Exercises: Prone   Hip Extension Strengthening;Both;1 set;10 reps   Contract/Relax to Increase Flexion with prone quad stretching                PT Education - 08/01/15 1356    Education provided Yes   Education Details goals, progress, reinforcing hip abd strength   Person(s) Educated Patient   Methods Explanation   Comprehension Verbalized understanding;Returned demonstration          PT Short Term Goals - 08/01/15 1027    PT SHORT TERM GOAL #1   Title Patient will be I with initial HEP   Status Achieved   PT SHORT TERM GOAL #2   Title Pt will be able to demo glute/hip ext strength to 4+/5    Status On-going   PT SHORT TERM GOAL #3   Title Pt will be able to to walk uphill without increased pain    Baseline running uphill with  fatigue is tough (mile 5-10) but walking uphill pace slower   Status On-going   PT SHORT TERM GOAL #4   Title Pt will be able to run up to 10 miles with only min fatigue/pulling in ant hip/groin   Baseline improved with 9 miles   Status Partially Met           PT Long Term Goals - 08/01/15 1029    PT LONG TERM GOAL #1   Title Pt will be I with more advanced HEP and be consistent.    Status On-going   PT LONG TERM GOAL #2   Title Pt will complete training for Iron [employer] and report min tightness/pain after running    Status On-going   PT LONG TERM GOAL #3   Title Patient will be able to perform double leg lower exercise to 45 deg and maintain lumbar/pelvic neutral (head up if needed)   Status On-going   PT LONG TERM GOAL #4   Title Pt will demo 5/5 hamstring, glute med and abd/adductor strength bilaterally for optimal running  mechanics   Status On-going               Plan - 08/01/15 1406    Clinical Impression Statement Patient has been able to report improvement in symptoms of weakness, fatigue in anterior hip/groin with running >5 miles.  Does not feel the excessive faitgue until after mile 8-9.  He has been sore after sessions but PT has not increased any pain.  He continues to exhibit hip and core weakness (glutes 3+/5) which he recognizes as significant.  Cont to progress with Sept 25 th as the date of target (Full Iron Man)    PT Next Visit Plan progress abs, core with anterior hip stretching, thoracic extension, cont Pilates and await dry needling 8/26   PT Home Exercise Plan given HEP: single leg bridge, hip flex stretch, lower ab table top, hip add and glute med, also gave clam and bridge with blue band, MET: resisted hip flexion L   Consulted and Agree with Plan of Care Patient        Problem List Patient Active Problem List   Diagnosis Date Noted  . Adductor tendinitis 04/18/2015  . 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 Bunion of great toe 04/13/2013  . Elbow pain 01/01/2012  . Lateral epicondylitis (tennis elbow) 01/01/2012  . KNEE PAIN, LEFT 11/20/2009  . FOOT PAIN, BILATERAL 04/17/2009  . METATARSALGIA 01/31/2009  . 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

## 2015-08-02 ENCOUNTER — Encounter: Payer: BLUE CROSS/BLUE SHIELD | Admitting: Physical Therapy

## 2015-08-07 ENCOUNTER — Encounter: Payer: BLUE CROSS/BLUE SHIELD | Admitting: Physical Therapy

## 2015-08-09 ENCOUNTER — Ambulatory Visit: Payer: BLUE CROSS/BLUE SHIELD | Admitting: Physical Therapy

## 2015-08-09 DIAGNOSIS — R1031 Right lower quadrant pain: Secondary | ICD-10-CM

## 2015-08-09 DIAGNOSIS — R1032 Left lower quadrant pain: Secondary | ICD-10-CM

## 2015-08-09 DIAGNOSIS — R293 Abnormal posture: Secondary | ICD-10-CM

## 2015-08-09 DIAGNOSIS — R29898 Other symptoms and signs involving the musculoskeletal system: Secondary | ICD-10-CM

## 2015-08-09 DIAGNOSIS — M6289 Other specified disorders of muscle: Secondary | ICD-10-CM | POA: Diagnosis not present

## 2015-08-09 NOTE — Therapy (Signed)
[place], [place], [address] Phone: [phone]   Fax:  [phone]  Physical Therapy Treatment  Patient Details  Name: Nicholas Rhodes MRN: [id number] Date of Birth: 07/06/1961 Referring Provider:  Maury Dus, MD  Encounter Date: 08/09/2015      PT End of Session - 08/09/15 0825    Visit Number 7   Number of Visits 12   Date for PT Re-Evaluation 08/31/15   PT Start Time 0800   PT Stop Time 0850   PT Time Calculation (min) 50 min   Activity Tolerance Patient tolerated treatment well      No past medical history on file.  No past surgical history on file.  There were no vitals filed for this visit.  Visit Diagnosis:  Weakness of both hips  Abnormal posture  Pain in the groin, left  Pain in the groin, right      Subjective Assessment - 08/09/15 0803    Subjective Ran 9 miles this AM.  1st 5 miles good. Last 5 miles Rt. post hip/glute and ant hip/groin and heaviness.  Has difficulty with uphill.  Did not take Aleve or anything.     Currently in Pain? Yes  Tightness ant hip/lower abd and Rt. post hip   Pain Type Chronic pain   Pain Onset More than a month ago   Pain Frequency Intermittent             [facility] Adult PT Treatment/Exercise - 08/09/15 0814    Knee/Hip Exercises: Stretches   Active Hamstring Stretch 3 reps;30 seconds   Quad Stretch 3 reps;30 seconds   Hip Flexor Stretch 3 reps;30 seconds   ITB Stretch Both;3 reps;30 seconds   Piriformis Stretch Both;2 reps;30 seconds   Knee/Hip Exercises: Supine   Bridges with Clamshell Strengthening;Both;1 set;20 reps   Single Leg Bridge Strengthening;Both;1 set;10 reps  maintain bridge and march with knee ext   Straight Leg Raises Strengthening;Right;Left;1 set;15 reps   Straight Leg Raises Limitations hip abd  each    Iontophoresis   Type of Iontophoresis Dexamethasone   Location Rt. pectineus   Dose 1 cc   Time 6 hr     Manual Therapy   Manual Therapy Myofascial release   Manual therapy comments Rt.> L anterior hip medial to ASIS and into psoas for release. Pt with min discomfort.  Pain in Rt. ant hip with true single knee to chest NO ER.  Pain in L post hip with L single knee to chest, less in groin.                 PT Education - 08/09/15 9169    Education provided Yes          PT Short Term Goals - 08/01/15 1027    PT SHORT TERM GOAL #1   Title Patient will be I with initial HEP   Status Achieved   PT SHORT TERM GOAL #2   Title Pt will be able to demo glute/hip ext strength to 4+/5    Status On-going   PT SHORT TERM GOAL #3   Title Pt will be able to to walk uphill without increased pain    Baseline running uphill with fatigue is tough (mile 5-10) but walking uphill pace slower   Status On-going   PT SHORT TERM GOAL #4   Title Pt will be able to run up to 10 miles with only min fatigue/pulling in ant hip/groin   Baseline  improved with 9 miles   Status Partially Met           PT Long Term Goals - 08/01/15 1029    PT LONG TERM GOAL #1   Title Pt will be I with more advanced HEP and be consistent.    Status On-going   PT LONG TERM GOAL #2   Title Pt will complete training for Iron [employer] and report min tightness/pain after running    Status On-going   PT LONG TERM GOAL #3   Title Patient will be able to perform double leg lower exercise to 45 deg and maintain lumbar/pelvic neutral (head up if needed)   Status On-going   PT LONG TERM GOAL #4   Title Pt will demo 5/5 hamstring, glute med and abd/adductor strength bilaterally for optimal running mechanics   Status On-going               Plan - 08/09/15 0845    Clinical Impression Statement Patient cont with fatigue in anterior hip/ and very localized pain in Rt. pectineus  vs. psoas.  He admits to neglecting HEP due to being out of town, although he was very active. Trial of ionto today, comes [place] for dry needling.  Goals in progress.    PT Next Visit Plan progress lower abd strength (level 1-2), anterior hip stretching, thoracic extension, assess ionto   PT Home Exercise Plan given HEP: single leg bridge, hip flex stretch, lower ab table top, hip add and glute med, also gave clam and bridge with blue band, MET: resisted hip flexion L   Consulted and Agree with Plan of Care Patient        Problem List Patient Active Problem List   Diagnosis Date Noted  . Adductor tendinitis 04/18/2015  . 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 Bunion of great toe 04/13/2013  . Elbow pain 01/01/2012  . Lateral epicondylitis (tennis elbow) 01/01/2012  . KNEE PAIN, LEFT 11/20/2009  . FOOT PAIN, BILATERAL 04/17/2009  . METATARSALGIA 01/31/2009  . 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

## 2015-08-11 ENCOUNTER — Ambulatory Visit: Payer: BLUE CROSS/BLUE SHIELD | Admitting: Physical Therapy

## 2015-08-11 DIAGNOSIS — R1031 Right lower quadrant pain: Secondary | ICD-10-CM

## 2015-08-11 DIAGNOSIS — R29898 Other symptoms and signs involving the musculoskeletal system: Secondary | ICD-10-CM

## 2015-08-11 DIAGNOSIS — R293 Abnormal posture: Secondary | ICD-10-CM

## 2015-08-11 DIAGNOSIS — R1032 Left lower quadrant pain: Secondary | ICD-10-CM

## 2015-08-11 DIAGNOSIS — M6289 Other specified disorders of muscle: Secondary | ICD-10-CM | POA: Diagnosis not present

## 2015-08-11 NOTE — Therapy (Signed)
[place], [place], [address] Phone: [phone]   Fax:  [phone]  Physical Therapy Treatment  Patient Details  Name: Nicholas Rhodes MRN: [id number] Date of Birth: 1961/03/26 Referring Provider:  Maury Dus, MD  Encounter Date: 08/11/2015      PT End of Session - 08/11/15 0954    Visit Number 8   Number of Visits 12   Date for PT Re-Evaluation 08/31/15   PT Start Time 0700   PT Stop Time 0753   PT Time Calculation (min) 53 min   Activity Tolerance Patient tolerated treatment well      No past medical history on file.  No past surgical history on file.  There were no vitals filed for this visit.  Visit Diagnosis:  Weakness of both hips  Abnormal posture  Pain in the groin, left  Pain in the groin, right                       [facility] Adult PT Treatment/Exercise - 08/11/15 0951    Iontophoresis   Type of Iontophoresis Dexamethasone   Location lower abdominal right, right rectus femoris insertion   Dose 1 cc   Time 6 hr    Manual Therapy   Manual Therapy Soft tissue mobilization;Myofascial release   Soft tissue mobilization right rectus femoris,adductors, vastus lateralis,HS, gluteals   Myofascial Release Graston technique instrument assisted myofascial work sweeping, strumming, fanning lightly G3, G4          Trigger Point Dry Needling - 08/11/15 0953    Consent Given? Yes   Education Handout Provided Yes   Muscles Treated Lower Body Quadriceps;Adductor longus/brevius/maximus;Hamstring   Quadriceps Response Twitch response elicited;Palpable increased muscle length   Adductor Response Palpable increased muscle length   Hamstring Response Twitch response elicited;Palpable increased muscle length      Right side only        PT Education - 08/11/15 0950    Education provided Yes   Education Details dry needling info    Person(s) Educated Patient    Methods Explanation;Handout   Comprehension Verbalized understanding          PT Short Term Goals - 08/11/15 1001    PT SHORT TERM GOAL #1   Title Patient will be I with initial HEP   Status Achieved   PT SHORT TERM GOAL #2   Title Pt will be able to demo glute/hip ext strength to 4+/5    Time 4   Period Weeks   Status On-going   PT SHORT TERM GOAL #3   Title Pt will be able to to walk uphill without increased pain    Time 4   Period Weeks   Status On-going   PT SHORT TERM GOAL #4   Title Pt will be able to run up to 10 miles with only min fatigue/pulling in ant hip/groin   Time 4   Period Weeks   Status Partially Met           PT Long Term Goals - 08/11/15 1002    PT LONG TERM GOAL #1   Title Pt will be I with more advanced HEP and be consistent.    Time 8   Period Weeks   Status On-going   PT LONG TERM GOAL #2   Title Pt will complete training for Iron [employer] and report min tightness/pain after running    Time 8   Period Weeks  Status On-going   PT LONG TERM GOAL #3   Title Patient will be able to perform double leg lower exercise to 45 deg and maintain lumbar/pelvic neutral (head up if needed)   Time 8   Period Weeks   Status On-going   PT LONG TERM GOAL #4   Title Pt will demo 5/5 hamstring, glute med and abd/adductor strength bilaterally for optimal running mechanics   Time 8   Period Weeks   Status On-going               Plan - 08/11/15 1749    Clinical Impression Statement Pain produced in suprapubic region with double leg raises as well as sit-up.  Secondary tender points in right inguinal region.  With direct palptaion also has soft tissue changes in adductor longus, medial HS and vastus lateralis.  No immediate changes post treatment.   He reports some improvement following ionto treatment previously but has not tried running yet.  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  Therapist closely monitoring response throughout  treatment and precaution taken around vascular and neural structures.     PT Next Visit Plan assess response to dry needling and Graston technique; consider dry needling rectus abdominals; ionto        Problem List Patient Active Problem List   Diagnosis Date Noted  . Adductor tendinitis 04/18/2015  . 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 Bunion of great toe 04/13/2013  . Elbow pain 01/01/2012  . Lateral epicondylitis (tennis elbow) 01/01/2012  . KNEE PAIN, LEFT 11/20/2009  . FOOT PAIN, BILATERAL 04/17/2009  . METATARSALGIA 01/31/2009  . 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 [place], [place], [address] Phone: [phone]   Fax:  [phone]  Ruben Im, PT 08/11/2015 10:05 AM Phone: [phone] Fax: [phone]

## 2015-08-11 NOTE — Patient Instructions (Signed)

## 2015-08-14 ENCOUNTER — Encounter: Payer: BLUE CROSS/BLUE SHIELD | Admitting: Physical Therapy

## 2015-08-15 ENCOUNTER — Ambulatory Visit: Payer: BLUE CROSS/BLUE SHIELD | Admitting: Physical Therapy

## 2015-08-15 DIAGNOSIS — R293 Abnormal posture: Secondary | ICD-10-CM

## 2015-08-15 DIAGNOSIS — M6289 Other specified disorders of muscle: Secondary | ICD-10-CM | POA: Diagnosis not present

## 2015-08-15 DIAGNOSIS — R29898 Other symptoms and signs involving the musculoskeletal system: Secondary | ICD-10-CM

## 2015-08-15 DIAGNOSIS — R1032 Left lower quadrant pain: Secondary | ICD-10-CM

## 2015-08-15 DIAGNOSIS — R1031 Right lower quadrant pain: Secondary | ICD-10-CM

## 2015-08-16 ENCOUNTER — Ambulatory Visit: Payer: BLUE CROSS/BLUE SHIELD | Admitting: Physical Therapy

## 2015-08-16 NOTE — Therapy (Signed)
[place], [place], [address] Phone: [phone]   Fax:  [phone]  Physical Therapy Treatment  Patient Details  Name: Nicholas Rhodes MRN: [id number] Date of Birth: [birth year] Referring Provider:  Maury Dus, MD  Encounter Date: 08/15/2015      PT End of Session - 08/16/15 0806    Visit Number 9   Number of Visits 12   Date for PT Re-Evaluation 08/31/15      No past medical history on file.  No past surgical history on file.  There were no vitals filed for this visit.  Visit Diagnosis:  Weakness of both hips  Abnormal posture  Pain in the groin, left  Pain in the groin, right      Subjective Assessment - 08/15/15 1331    Subjective Worked out a lot over the weekend.  Swam on Friday 1500 m.  Saturday, rode 60 miles and swam.  Sunday and Monday ran.  It wasn't as bad running.  Last night medial thigh pain.  Decreased lower abdominal pain but still there but better.  Iron[employer] 9/25.  Doing hip abduction exercises.     Currently in Pain? Yes   Pain Score 2    Pain Location Hip   Pain Orientation Right   Pain Type Chronic pain   Aggravating Factors  running                          [facility] Adult PT Treatment/Exercise - 08/16/15 0001    Iontophoresis   Type of Iontophoresis Dexamethasone   Location lower abdominal right, right rectus femoris insertion   Dose 1 cc x2   Time 6 hr    Manual Therapy   Manual Therapy Soft tissue mobilization;Myofascial release   Soft tissue mobilization right rectus femoris,adductors, vastus lateralis,HS, gluteals   Myofascial Release Graston technique instrument assisted myofascial work sweeping, strumming, fanning lightly G3, G4          Trigger Point Dry Needling - 08/16/15 0804    Consent Given? Yes   Muscles Treated Lower Body Adductor longus/brevius/maximus  rectus femoris   Adductor Response Twitch response  elicited;Palpable increased muscle length        Right side only          PT Short Term Goals - 08/16/15 0813    PT SHORT TERM GOAL #1   Title Patient will be I with initial HEP   Status Achieved   PT SHORT TERM GOAL #2   Title Pt will be able to demo glute/hip ext strength to 4+/5    Time 4   Period Weeks   Status On-going   PT SHORT TERM GOAL #3   Title Pt will be able to to walk uphill without increased pain    Time 4   Period Weeks   Status On-going   PT SHORT TERM GOAL #4   Title Pt will be able to run up to 10 miles with only min fatigue/pulling in ant hip/groin   Time 4   Period Weeks   Status Partially Met           PT Long Term Goals - 08/16/15 0813    PT LONG TERM GOAL #1   Title Pt will be I with more advanced HEP and be consistent.    Time 8   Period Weeks   Status On-going   PT LONG TERM GOAL #2   Title Pt  will complete training for Iron [employer] and report min tightness/pain after running    Time 8   Period Weeks   Status On-going   PT LONG TERM GOAL #3   Title Patient will be able to perform double leg lower exercise to 45 deg and maintain lumbar/pelvic neutral (head up if needed)   Time 8   Period Weeks   Status On-going   PT LONG TERM GOAL #4   Title Pt will demo 5/5 hamstring, glute med and abd/adductor strength bilaterally for optimal running mechanics   Time 8   Period Weeks   Status On-going               Plan - 08/16/15 0806    Clinical Impression Statement The patient continues to train for his Ironman event 9/25.  He reports he continues to swim,bike and run but has been limited by the heat.  Reports dry needling, manual and ionto seem to be helping. Today his primary area of (mild pain) right hip adductors, secondary area lower abdominals.  Improved muscle length following treatment session.  Expected post treatment soreness.  Therapist closely monitoring response throughout treatment plan.     PT Next Visit Plan assess  response to dry needling and Graston technique; ionto        Problem List Patient Active Problem List   Diagnosis Date Noted  . Adductor tendinitis 04/18/2015  . 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 Bunion of great toe 04/13/2013  . Elbow pain 01/01/2012  . Lateral epicondylitis (tennis elbow) 01/01/2012  . KNEE PAIN, LEFT 11/20/2009  . FOOT PAIN, BILATERAL 04/17/2009  . METATARSALGIA 01/31/2009  . 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

## 2015-08-25 ENCOUNTER — Ambulatory Visit: Payer: BLUE CROSS/BLUE SHIELD | Attending: Sports Medicine | Admitting: Physical Therapy

## 2015-08-25 DIAGNOSIS — R1032 Left lower quadrant pain: Secondary | ICD-10-CM | POA: Insufficient documentation

## 2015-08-25 DIAGNOSIS — M6289 Other specified disorders of muscle: Secondary | ICD-10-CM | POA: Diagnosis present

## 2015-08-25 DIAGNOSIS — R1031 Right lower quadrant pain: Secondary | ICD-10-CM

## 2015-08-25 DIAGNOSIS — R29898 Other symptoms and signs involving the musculoskeletal system: Secondary | ICD-10-CM

## 2015-08-25 DIAGNOSIS — R293 Abnormal posture: Secondary | ICD-10-CM | POA: Diagnosis present

## 2015-08-25 NOTE — Therapy (Signed)
[place], [place], [address] Phone: [phone]   Fax:  [phone]  Physical Therapy Treatment  Patient Details  Name: Nicholas Rhodes MRN: [id number] Date of Birth: [birth year] Referring Provider:  Maury Dus, MD  Encounter Date: 08/25/2015      PT End of Session - 08/25/15 1204    Visit Number 10   Number of Visits 12   Date for PT Re-Evaluation 08/31/15   PT Start Time 2683   PT Stop Time 1145   PT Time Calculation (min) 43 min   Activity Tolerance Patient tolerated treatment well   Behavior During Therapy [facility] for tasks assessed/performed      No past medical history on file.  No past surgical history on file.  There were no vitals filed for this visit.  Visit Diagnosis:  Weakness of both hips  Abnormal posture  Pain in the groin, left  Pain in the groin, right      Subjective Assessment - 08/25/15 1105    Subjective Tightness no pain.  Ran 10 miles today.  I'm getting better leg lift on the road.  Feels  60  % better since Feb. ("my low point").  Discomfort ant Rt. abs/hip.    Currently in Pain? --   Pain Score 1    Pain Location Groin   Pain Orientation Right   Pain Descriptors / Indicators Tightness;Tiring;Heaviness   Pain Type Chronic pain   Pain Onset More than a month ago   Pain Frequency Several days a week   Aggravating Factors  running >9-10 miles   Pain Relieving Factors aleve rest   Effect of Pain on Daily Activities slows him down for training   Multiple Pain Sites No            [facility] PT Assessment - 08/25/15 1137    Strength   Right Hip Extension 4+/5   Right Hip ABduction 5/5  glute med 3+/5    Left Hip Extension 4/5   Left Hip ABduction 5/5  glute med 3+/5            [facility] Adult PT Treatment/Exercise - 08/25/15 1109    Lumbar Exercises: Supine   Other Supine Lumbar Exercises low abd single leg lower lift from tabletop and double leg  both are very challenging, muscle fatigues quickly.,    Other Supine Lumbar Exercises ant hip stretch off table with contract relax technique   Lumbar Exercises: Sidelying   Hip Abduction 10 reps   Hip Abduction Weights (lbs) sidekick series    Knee/Hip Exercises: Stretches   Active Hamstring Stretch Both;3 reps;30 seconds   Quad Stretch Both;3 reps;30 seconds   Knee/Hip Exercises: Prone   Hip Extension Strengthening;Both;10 reps   Hip Extension Limitations glute kick x 10 each    Straight Leg Raises Strengthening;Both;1 set;10 reps   Iontophoresis   Type of Iontophoresis Dexamethasone   Location lower abdominal right, right rectus femoris insertion   Dose 1 cc x2   Time 6 hr                 PT Education - 08/25/15 1204    Education provided Yes   Education Details HEP, abdominals   Person(s) Educated Patient   Methods Explanation   Comprehension Verbalized understanding          PT Short Term Goals - 08/25/15 1229    PT SHORT TERM GOAL #1   Title Patient will be I with initial  HEP   Status Achieved   PT SHORT TERM GOAL #2   Title Pt will be able to demo glute/hip ext strength to 4+/5    Status Partially Met   PT SHORT TERM GOAL #3   Title Pt will be able to to walk uphill without increased pain    Status On-going   PT SHORT TERM GOAL #4   Title Pt will be able to run up to 10 miles with only min fatigue/pulling in ant hip/groin   Status Partially Met           PT Long Term Goals - 08/25/15 1230    PT LONG TERM GOAL #1   Title Pt will be I with more advanced HEP and be consistent.    Status On-going   PT LONG TERM GOAL #2   Title Pt will complete training for Iron [employer] and report min tightness/pain after running    Status On-going   PT LONG TERM GOAL #3   Title Patient will be able to perform double leg lower exercise to 45 deg and maintain lumbar/pelvic neutral (head up if needed)   Status Partially Met   PT LONG TERM GOAL #4   Title Pt will demo  5/5 hamstring, glute med and abd/adductor strength bilaterally for optimal running mechanics   Status On-going               Plan - 08/25/15 1220    Clinical Impression Statement Patient is progressing towards goal of completing his Iron [employer].  He continues to have lower abdominal weakness and weak glutes.  He continues to benefit from PT to direct and guide exercises and provide dry needling to lower abs, hip mm.    PT Next Visit Plan cont TPDN, ionto, lower ab, glute med strength, release hip flexors   PT Home Exercise Plan given HEP: single leg bridge, hip flex stretch, lower ab table top, hip add and glute med, also gave clam and bridge with blue band, MET: resisted hip flexion L   Consulted and Agree with Plan of Care Patient        Problem List Patient Active Problem List   Diagnosis Date Noted  . Adductor tendinitis 04/18/2015  . 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 Bunion of great toe 04/13/2013  . Elbow pain 01/01/2012  . Lateral epicondylitis (tennis elbow) 01/01/2012  . KNEE PAIN, LEFT 11/20/2009  . FOOT PAIN, BILATERAL 04/17/2009  . METATARSALGIA 01/31/2009  . 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 [place], [place], [address] Phone: [phone]   Fax:  [phone]  Raeford Razor, PT 08/25/2015 12:31 PM Phone: [phone] Fax: [phone]

## 2015-08-29 ENCOUNTER — Ambulatory Visit: Payer: BLUE CROSS/BLUE SHIELD | Admitting: Physical Therapy

## 2015-08-29 DIAGNOSIS — R1031 Right lower quadrant pain: Secondary | ICD-10-CM

## 2015-08-29 DIAGNOSIS — R1032 Left lower quadrant pain: Secondary | ICD-10-CM

## 2015-08-29 DIAGNOSIS — R293 Abnormal posture: Secondary | ICD-10-CM

## 2015-08-29 DIAGNOSIS — R29898 Other symptoms and signs involving the musculoskeletal system: Secondary | ICD-10-CM

## 2015-08-29 DIAGNOSIS — M6289 Other specified disorders of muscle: Secondary | ICD-10-CM | POA: Diagnosis not present

## 2015-08-29 NOTE — Therapy (Signed)
[place], [place], [address] Phone: [phone]   Fax:  [phone]  Physical Therapy Treatment  Patient Details  Name: Nicholas Rhodes MRN: [id number] Date of Birth: 01-04-1961 Referring Provider:  Maury Dus, MD  Encounter Date: 08/29/2015      PT End of Session - 08/29/15 1939    Visit Number 11   Number of Visits 12   Date for PT Re-Evaluation 08/31/15   PT Start Time 1010   PT Stop Time 1103   PT Time Calculation (min) 53 min   Activity Tolerance Patient tolerated treatment well      No past medical history on file.  No past surgical history on file.  There were no vitals filed for this visit.  Visit Diagnosis:  Weakness of both hips  Abnormal posture  Pain in the groin, left  Pain in the groin, right      Subjective Assessment - 08/29/15 1011    Subjective Ran this morning and feels less groin but mostly lower abdominal pain.   Did session last time focusing on strengthening and used ionto patch.  Took off Sun/Mon.  Soreness from dry needling but not overly so.     Currently in Pain? No/denies   Pain Score 0-No pain   Pain Location Hip   Pain Orientation Right   Aggravating Factors  no problem swim or biking, primarily with run 10 miles                         [facility] Adult PT Treatment/Exercise - 08/29/15 0001    Iontophoresis   Type of Iontophoresis Dexamethasone   Location lower abdominals right   Dose 1cc   Time 6 hr    Manual Therapy   Manual Therapy Soft tissue mobilization;Myofascial release   Soft tissue mobilization right rectus femoris,adductors, vastus lateralis,HS, gluteals   Myofascial Release Graston technique instrument assisted myofascial work sweeping, strumming, fanning lightly G3, G4          Trigger Point Dry Needling - 08/29/15 1939    Consent Given? Yes   Muscles Treated Lower Body Quadriceps;Adductor  longus/brevius/maximus  rectus abdominus right   Quadriceps Response Twitch response elicited;Palpable increased muscle length   Adductor Response Twitch response elicited;Palpable increased muscle length     Right side only           PT Short Term Goals - 08/29/15 1945    PT SHORT TERM GOAL #1   Title Patient will be I with initial HEP   Status Achieved   PT SHORT TERM GOAL #2   Title Pt will be able to demo glute/hip ext strength to 4+/5    Time 4   Period Weeks   Status Partially Met   PT SHORT TERM GOAL #3   Title Pt will be able to to walk uphill without increased pain    Status Achieved   PT SHORT TERM GOAL #4   Title Pt will be able to run up to 10 miles with only min fatigue/pulling in ant hip/groin   Time 4   Period Weeks   Status Partially Met           PT Long Term Goals - 08/29/15 1945    PT LONG TERM GOAL #1   Title Pt will be I with more advanced HEP and be consistent.    Time 8   Period Weeks   Status On-going  PT LONG TERM GOAL #2   Title Pt will complete training for Iron [employer] and report min tightness/pain after running    Time 8   Period Weeks   Status On-going   PT LONG TERM GOAL #3   Title Patient will be able to perform double leg lower exercise to 45 deg and maintain lumbar/pelvic neutral (head up if needed)   Time 8   Period Weeks   Status Partially Met   PT LONG TERM GOAL #4   Title Pt will demo 5/5 hamstring, glute med and abd/adductor strength bilaterally for optimal running mechanics   Time 8   Period Weeks   Status On-going               Plan - 08/29/15 1941    Clinical Impression Statement The patient reports his right hip/pelvic pain has improved with decreased discomfort with running >10 miles.  No problem with swimming or biking.  Ironman competition is in 1 1/2 weeks.  Decreased trigger point size and number in adductors, rectus femoris and abdominals .  No difficulty with SLR today, minor discomfort with doing an  abdominal crunch.  Therapist closely monitoring response throughout treatment session.   PT Next Visit Plan recertification next visit, dry needling as needed, ionto, gluteal med strength, abdom strength, hip flexor strength        Problem List Patient Active Problem List   Diagnosis Date Noted  . Adductor tendinitis 04/18/2015  . 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 Bunion of great toe 04/13/2013  . Elbow pain 01/01/2012  . Lateral epicondylitis (tennis elbow) 01/01/2012  . KNEE PAIN, LEFT 11/20/2009  . FOOT PAIN, BILATERAL 04/17/2009  . METATARSALGIA 01/31/2009  . 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 [place], [place], [address] Phone: [phone]   Fax:  [phone]  Ruben Im, PT 08/29/2015 7:50 PM Phone: [phone] Fax: [phone]

## 2015-09-05 ENCOUNTER — Ambulatory Visit: Payer: BLUE CROSS/BLUE SHIELD | Admitting: Physical Therapy

## 2015-09-05 DIAGNOSIS — R1032 Left lower quadrant pain: Secondary | ICD-10-CM

## 2015-09-05 DIAGNOSIS — R293 Abnormal posture: Secondary | ICD-10-CM

## 2015-09-05 DIAGNOSIS — R1031 Right lower quadrant pain: Secondary | ICD-10-CM

## 2015-09-05 DIAGNOSIS — R29898 Other symptoms and signs involving the musculoskeletal system: Secondary | ICD-10-CM

## 2015-09-05 DIAGNOSIS — M6289 Other specified disorders of muscle: Secondary | ICD-10-CM | POA: Diagnosis not present

## 2015-09-05 NOTE — Therapy (Signed)
Nicholas Rhodes, [place], [address] Phone: [phone]   Fax:  [phone]  Physical Therapy Treatment/Recertification  Patient Details  Name: Nicholas Rhodes MRN: [id number] Date of Birth: 1961-06-21 Referring Provider:  Maury Dus, MD  Encounter Date: 09/05/2015      PT End of Session - 09/05/15 1339    Visit Number 12   Number of Visits 20   Date for PT Re-Evaluation 10/17/15   Authorization Type BCBS   PT Start Time 8811   PT Stop Time 1233   PT Time Calculation (min) 48 min   Activity Tolerance Patient tolerated treatment well      No past medical history on file.  No past surgical history on file.  There were no vitals filed for this visit.  Visit Diagnosis:  Weakness of both hips - Plan: PT plan of care cert/re-cert  Abnormal posture - Plan: PT plan of care cert/re-cert  Pain in the groin, left - Plan: PT plan of care cert/re-cert  Pain in the groin, right - Plan: PT plan of care cert/re-cert      Subjective Assessment - 09/05/15 1144    Subjective Ironman race is this Sunday.  Running is feeling better. 5 miles Fri.   Primary spot is right lower abdominals.  Felt it while doing some ab work in the pool this morning.  Overall he states "this is the best it has felt since November."   Currently in Pain? No/denies   Pain Location Abdomen   Pain Orientation Right   Pain Type Chronic pain   Pain Onset More than a month ago   Pain Frequency Intermittent   Aggravating Factors  running            [facility] PT Assessment - 09/05/15 0001    Strength   Right Hip Extension 4+/5   Right Hip ABduction 5/5   Left Hip Extension 4+/5   Left Hip ABduction 5/5   Right Knee Flexion 5/5   Right Knee Extension 5/5   Left Knee Flexion 5/5   Left Knee Extension 5/5   Lumbar Flexion 4/5   Lumbar Extension 5/5   Palpation   Palpation comment mild soreness adductor longus, magnus right  lower abdominals                     [facility] Adult PT Treatment/Exercise - 09/05/15 0001    Iontophoresis   Type of Iontophoresis Dexamethasone   Location lower abdominals right   Dose 1cc   Time 6 hr    Manual Therapy   Manual Therapy Soft tissue mobilization;Myofascial release   Soft tissue mobilization right rectus femoris,adductors, vastus lateralis,HS, gluteals   Myofascial Release Graston technique instrument assisted myofascial work sweeping, strumming, fanning lightly G3, G4          Trigger Point Dry Needling - 09/05/15 1339    Consent Given? Yes   Muscles Treated Lower Body Adductor longus/brevius/maximus  right abdominals   Adductor Response Twitch response elicited;Palpable increased muscle length      Right only          PT Short Term Goals - 09/05/15 1348    PT SHORT TERM GOAL #1   Title Patient will be I with initial HEP   Status Achieved   PT SHORT TERM GOAL #2   Title Pt will be able to demo glute/hip ext strength to 4+/5    Status Achieved   PT SHORT TERM GOAL #  3   Title Pt will be able to to walk uphill without increased pain    Status Achieved   PT SHORT TERM GOAL #4   Title Pt will be able to run up to 10 miles with only min fatigue/pulling in ant hip/groin   Time 4   Period Weeks   Status Partially Met           PT Long Term Goals - 09/05/15 1348    PT LONG TERM GOAL #1   Title Pt will be I with more advanced HEP and be consistent.    Time 8   Period Weeks   Status Partially Met   PT LONG TERM GOAL #2   Title Pt will complete training for Iron [employer] and report min tightness/pain after running    Time 8   Period Weeks   Status On-going   PT LONG TERM GOAL #3   Title Patient will be able to perform double leg lower exercise to 45 deg and maintain lumbar/pelvic neutral (head up if needed)   Time 8   Period Weeks   Status Partially Met   PT LONG TERM GOAL #4   Title Pt will demo 5/5 hamstring, glute med and  abd/adductor strength bilaterally for optimal running mechanics   Time 8   Period Weeks   Status On-going               Plan - 09/05/15 1340    Clinical Impression Statement The patient reports decreasing pain presence and now isolated to right lower abdominals with running only.  He is preparing for his Ironman race this Sunday and is unsure how his hip/pelvic pain will be.  Improving hip 4+/5 and abdominal strength 4/5 .  No pain with SLR but some minor discomfort with trunk flexion (sit up) position.  Good response to dry needling, Graston technique, core strengthening, gluteal strengthening and iontophoresis.  Recommend decreasing visits, with one more before his competition and follow up after if needed.   Pt will benefit from skilled therapeutic intervention in order to improve on the following deficits Increased fascial restricitons;Difficulty walking;Pain;Impaired flexibility;Decreased strength;Postural dysfunction   Rehab Potential Excellent   PT Frequency 2x / week   PT Duration 6 weeks   PT Treatment/Interventions Iontophoresis 86m/ml Dexamethasone;Functional mobility training;Patient/family education;Moist Heat;Ultrasound;Therapeutic exercise;Dry needling;Manual techniques;Neuromuscular re-education;Taping;Cryotherapy   PT Next Visit Plan  ionto, gluteal med strength, abdom strength, hip flexor strength; kinesiotaping        Problem List Patient Active Problem List   Diagnosis Date Noted  . Adductor tendinitis 04/18/2015  . 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 Bunion of great toe 04/13/2013  . Elbow pain 01/01/2012  . Lateral epicondylitis (tennis elbow) 01/01/2012  . KNEE PAIN, LEFT 11/20/2009  . FOOT PAIN, BILATERAL 04/17/2009  . METATARSALGIA 01/31/2009  . 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

## 2015-09-07 ENCOUNTER — Ambulatory Visit: Payer: BLUE CROSS/BLUE SHIELD | Admitting: Physical Therapy

## 2015-09-07 DIAGNOSIS — R29898 Other symptoms and signs involving the musculoskeletal system: Secondary | ICD-10-CM

## 2015-09-07 DIAGNOSIS — M6289 Other specified disorders of muscle: Secondary | ICD-10-CM | POA: Diagnosis not present

## 2015-09-07 DIAGNOSIS — R1031 Right lower quadrant pain: Secondary | ICD-10-CM

## 2015-09-07 DIAGNOSIS — R1032 Left lower quadrant pain: Secondary | ICD-10-CM

## 2015-09-07 DIAGNOSIS — R293 Abnormal posture: Secondary | ICD-10-CM

## 2015-09-07 NOTE — Therapy (Addendum)
[place], [place], [address] Phone: [phone]   Fax:  [phone]  Physical Therapy Treatment  Patient Details  Name: Nicholas Rhodes MRN: [id number] Date of Birth: [birth year] Referring Provider:  Maury Dus, MD  Encounter Date: 09/07/2015      PT End of Session - 09/07/15 1253    Visit Number 13   Number of Visits 20   Date for PT Re-Evaluation 10/17/15   Authorization Type BCBS   PT Start Time 1017   PT Stop Time 1055   PT Time Calculation (min) 38 min   Activity Tolerance Patient tolerated treatment well   Behavior During Therapy [facility] for tasks assessed/performed      No past medical history on file.  No past surgical history on file.  There were no vitals filed for this visit.  Visit Diagnosis:  Weakness of both hips  Abnormal posture  Pain in the groin, left  Pain in the groin, right      Subjective Assessment - 09/07/15 1029    Subjective Really sore today from dry needling Tuesday.  Usually feels better after the 2nd day.  Open to trying a Kinesiotape pattern.  Slept in today, the hard work has been done!   Currently in Pain? No/denies            [facility] Adult PT Treatment/Exercise - 09/07/15 1030    Self-Care   Self-Care Other Self-Care Comments   Other Self-Care Comments  application of kinesiotape   Lumbar Exercises: Supine   Other Supine Lumbar Exercises foam roller stabilization exercises:   clam x 20 bilateral and unilateral   Other Supine Lumbar Exercises alternating UE and LE , added hip and knee extension and horiz abd, circles   Kinesiotix   Inhibit Muscle  psoas   Facilitate Muscle  transverse abd.                 PT Education - 09/07/15 1253    Education provided Yes   Education Details Science writer) Educated Patient   Methods Explanation;Demonstration;Handout   Comprehension Verbalized understanding           PT Short Term Goals - 09/05/15 1348    PT SHORT TERM GOAL #1   Title Patient will be I with initial HEP   Status Achieved   PT SHORT TERM GOAL #2   Title Pt will be able to demo glute/hip ext strength to 4+/5    Status Achieved   PT SHORT TERM GOAL #3   Title Pt will be able to to walk uphill without increased pain    Status Achieved   PT SHORT TERM GOAL #4   Title Pt will be able to run up to 10 miles with only min fatigue/pulling in ant hip/groin   Time 4   Period Weeks   Status Partially Met           PT Long Term Goals - 09/05/15 1348    PT LONG TERM GOAL #1   Title Pt will be I with more advanced HEP and be consistent.    Time 8   Period Weeks   Status Partially Met   PT LONG TERM GOAL #2   Title Pt will complete training for Iron [employer] and report min tightness/pain after running    Time 8   Period Weeks   Status On-going   PT LONG TERM GOAL #3   Title Patient will be able  to perform double leg lower exercise to 45 deg and maintain lumbar/pelvic neutral (head up if needed)   Time 8   Period Weeks   Status Partially Met   PT LONG TERM GOAL #4   Title Pt will demo 5/5 hamstring, glute med and abd/adductor strength bilaterally for optimal running mechanics   Time 8   Period Weeks   Status On-going               Plan - 09/07/15 1308    Clinical Impression Statement Pt race is coming up this Sunday. He was shown how to Kinesiotape himself if needed. He was also taped prior to leaving today to see if he felt it was worth wearing for the race. Pt declined ionto patch and just wants to see how the kinesiotape helps. Goals ongoing.   PT Next Visit Plan follow up on how iron man went, tape, deep stretching to assist in recovery, ionto if needed   PT Home Exercise Plan none added   Consulted and Agree with Plan of Care Patient        Problem List Patient Active Problem List   Diagnosis Date Noted  . Adductor tendinitis 04/18/2015  . Osteitis pubis  01/24/2015  . Bunion of great toe 04/13/2013  . Elbow pain 01/01/2012  . Lateral epicondylitis (tennis elbow) 01/01/2012  . KNEE PAIN, LEFT 11/20/2009  . FOOT PAIN, BILATERAL 04/17/2009  . METATARSALGIA 01/31/2009  . 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  I do know that Nicholas Rhodes was unable to complete the Iron Man competition fully, but he was "ok" with that.  It has been some time since I have talked to him.    Education / Equipment: Core, glute med strength, Pilates, HEP, dry needling Plan: Patient agrees to discharge.  Patient goals were not met. Patient is being discharged due to not returning since the last visit.  ?????      Raeford Razor, PT 03/07/2016 11:26 AM Phone: [phone] Fax: [phone]

## 2015-09-07 NOTE — Patient Instructions (Signed)
Given handout on how to apply kinesiotape himself for his race on Sunday Kinesio for inhibition of hip flexors and transverse abdominis support
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
## 2016-05-31 DIAGNOSIS — H00022 Hordeolum internum right lower eyelid: Secondary | ICD-10-CM | POA: Diagnosis not present

## 2016-06-10 DIAGNOSIS — B429 Sporotrichosis, unspecified: Secondary | ICD-10-CM | POA: Diagnosis not present

## 2016-09-11 DIAGNOSIS — Z23 Encounter for immunization: Secondary | ICD-10-CM | POA: Diagnosis not present

## 2016-11-18 DIAGNOSIS — Z23 Encounter for immunization: Secondary | ICD-10-CM | POA: Diagnosis not present

## 2016-11-18 DIAGNOSIS — Z0000 Encounter for general adult medical examination without abnormal findings: Secondary | ICD-10-CM | POA: Diagnosis not present

## 2016-11-18 DIAGNOSIS — Z13220 Encounter for screening for lipoid disorders: Secondary | ICD-10-CM | POA: Diagnosis not present

## 2016-11-18 DIAGNOSIS — Z125 Encounter for screening for malignant neoplasm of prostate: Secondary | ICD-10-CM | POA: Diagnosis not present

## 2017-02-26 DIAGNOSIS — S61011A Laceration without foreign body of right thumb without damage to nail, initial encounter: Secondary | ICD-10-CM | POA: Diagnosis not present

## 2017-02-26 DIAGNOSIS — J309 Allergic rhinitis, unspecified: Secondary | ICD-10-CM | POA: Diagnosis not present

## 2017-04-16 ENCOUNTER — Encounter: Payer: Self-pay | Admitting: Family Medicine

## 2017-04-16 ENCOUNTER — Ambulatory Visit (INDEPENDENT_AMBULATORY_CARE_PROVIDER_SITE_OTHER): Payer: BLUE CROSS/BLUE SHIELD | Admitting: Family Medicine

## 2017-04-16 VITALS — BP 111/48 | Ht 72.0 in | Wt 173.0 lb

## 2017-04-16 DIAGNOSIS — S76312A Strain of muscle, fascia and tendon of the posterior muscle group at thigh level, left thigh, initial encounter: Secondary | ICD-10-CM

## 2017-04-16 DIAGNOSIS — M25522 Pain in left elbow: Secondary | ICD-10-CM | POA: Diagnosis not present

## 2017-04-17 DIAGNOSIS — S76312A Strain of muscle, fascia and tendon of the posterior muscle group at thigh level, left thigh, initial encounter: Secondary | ICD-10-CM | POA: Insufficient documentation

## 2017-04-17 NOTE — Assessment & Plan Note (Addendum)
Doesn't demonstrate any structural abnormality. Possible for overuse of his common extensors. He also reports pain with curls which doesn't fit his picture. Reports some medial epicondyle pain but is inconsistent and infrequent. Likely related to his training. He swims on a regular basis.  - will continue dry needling for now  - will wear his body helix while swimming  - if no improvement can consider an MRI of his elbow.

## 2017-04-17 NOTE — Progress Notes (Signed)
  Nicholas Rhodes - 56 y.o. male MRN [id number]  Date of birth: 06/08/1961  SUBJECTIVE:  Including CC & ROS.   [name] is a 56 yo M that is presenting with radial sided forearm pain dorsally and some pain around his medial epicondyle. This is occurring on his left elbow. Has been occurring for one month. Denies any injury to elbow. Has tried a brace. Feels the pain while swimming the most. He competes in triathlons. He has a history of lateral epicondylitis. He has had dry needling of this common extensors and that has helped some. He has taken any medications. He has tried NTG patches in the past and those caused him a headache.   He also had some left hamstring pain but that has seemed to resolve since making the appointment. The pain was in the midbelly of the left hamstring and radiated proximally toward the origin. The pain was worse with riding his bike.   ROS: No unexpected weight loss, fever, chills, swelling, instability, muscle pain, numbness/tingling, redness, otherwise see HPI   HISTORY: Past Medical, Surgical, Social, and Family History Reviewed & Updated per EMR.   Pertinent Historical Findings include: PSHx -  No tobacco use   DATA REVIEWED: none  PHYSICAL EXAM:  VS: BP (!) 111/48   Ht 6' (1.829 m)   Wt 173 lb (78.5 kg)   BMI 23.46 kg/m  PHYSICAL EXAM: Gen: NAD, alert, cooperative with exam, well-appearing HEENT: clear conjunctiva, EOMI CV:  no edema, capillary refill brisk,  Resp: non-labored, normal speech Skin: no rashes, normal turgor  Neuro: no gross deficits.  Psych:  alert and oriented MSK:  Left elbow:  TTP with deep palpation of the common extensors  No TTP of the medial or lateral epicondyle No TTP over the triceps tendon or olecranon  No obvious effusion  Normal elbow ROM  Normal shoulder ROm  Normal wrist ROM  Normal strength in flexion and extension  Normal wrist flexion and extension strength No significant pain with middle finger  extended to resistance.  Left Hamstring  No TTP of the ischial tuberosity or the midbelly of the hamstring Normal hip ROM  Normal strength to resistance  Neurovascularly intact   Limited ultrasound: left elbow:  The medial and lateral epicondyle were viewed and there were no splits or tears appreciated. There was not increase in doppler flow.  The lateral epicondyle did have some spurring.  The olecranon and triceps tendon were normal in appearance.  The common extensor tendons and muscles appeared normal  Summary: findings are consistent with mild spurring of the lateral epicondyle but an otherwise normal exam.   Ultrasound and interpretation by Clare GandyJeremy Rosario Duey, MD   ASSESSMENT & PLAN:   Elbow pain Doesn't demonstrate any structural abnormality. Possible for overuse of his common extensors. He also reports pain with curls which doesn't fit his picture. Reports some medial epicondyle pain but is inconsistent and infrequent. Likely related to his training. He swims on a regular basis.  - will continue dry needling for now  - will wear his body helix while swimming  - if no improvement can consider an MRI of his elbow.   Left hamstring muscle strain Reports his had improvement of his pain of his left hamstring. Feels more when cycling. Likely related to be quad dominant.  - provided home exercises  - can f/u if needed

## 2017-04-17 NOTE — Assessment & Plan Note (Signed)
Reports his had improvement of his pain of his left hamstring. Feels more when cycling. Likely related to be quad dominant.  - provided home exercises  - can f/u if needed

## 2017-04-29 DIAGNOSIS — D3132 Benign neoplasm of left choroid: Secondary | ICD-10-CM | POA: Diagnosis not present

## 2017-05-01 ENCOUNTER — Ambulatory Visit: Payer: Self-pay

## 2017-05-01 ENCOUNTER — Encounter: Payer: Self-pay | Admitting: Sports Medicine

## 2017-05-01 ENCOUNTER — Ambulatory Visit (INDEPENDENT_AMBULATORY_CARE_PROVIDER_SITE_OTHER): Payer: BLUE CROSS/BLUE SHIELD | Admitting: Sports Medicine

## 2017-05-01 VITALS — BP 121/68 | Ht 72.0 in | Wt 173.0 lb

## 2017-05-01 DIAGNOSIS — M7712 Lateral epicondylitis, left elbow: Secondary | ICD-10-CM

## 2017-05-01 NOTE — Progress Notes (Signed)
  Elmer RampBradley SCOTT Dosch - 56 y.o. male MRN [id number]  Date of birth: 04/05/1961  SUBJECTIVE:  Including CC & ROS.  CC: left elbow pain  Presents with left lateral elbow pain that has been ongoing for 6 weeks.  He has been trying exercises, bracing, NSAIDs without relief.  He reports that it began when he was doing bicep curls.  He denies numbness or tingling.  He was seen in the office 2 weeks ago and diagnosed with lateral epicondylitis.  He was given light exercises to do and to do activity modification.  He has been doing them without much benefit.  Wears a body helix, but it does not appear to help.  He presents for further recommendations.  He has previously tried NTG but had a severe headache.   ROS: No unexpected weight loss, fever, chills, swelling, instability, muscle pain, numbness/tingling, redness, otherwise see HPI   PMHx - Updated and reviewed.  Contributory factors include: tennis elbow PSHx - Updated and reviewed.  Contributory factors include:  Negative FHx - Updated and reviewed.  Contributory factors include:  Negative Social Hx - Updated and reviewed. Contributory factors include: Lifts weights, tennis player, swimmer Medications - reviewed   DATA REVIEWED: Previous u/s by Dr. Jordan LikesSchmitz which did not show structural abnormality in left elbow laterally  PHYSICAL EXAM:  VS: BP:121/68  HR: bpm  TEMP: ( )  RESP:   HT:6' (182.9 cm)   WT:173 lb (78.5 kg)  BMI:23.5 PHYSICAL EXAM: Gen: NAD, alert, cooperative with exam, well-appearing HEENT: clear conjunctiva,  CV:  no edema, capillary refill brisk, normal rate Resp: non-labored Skin: no rashes, normal turgor  Neuro: no gross deficits.  Psych:  alert and oriented Elbow: Unremarkable to inspection. Range of motion full pronation, supination, flexion, extension. Strength is full to all of the above directions Stable to varus, valgus stress. Negative moving valgus stress test. +tenderness to palpation at lateral  epicondyle Ulnar nerve does not sublux. Negative cubital tunnel Tinel's. +book test   Limited ultrasound of left elbow:  Lateral epicondyle shows tearing of common extensors deep and posterior at insertion into lateral epicondyle Increased doppler flow is seen in the tendon No retracted tearing seen  Findings consistent with lateral epicondylitis  Ultrasound performed and interpreted by Cardell PeachAlicia Jeffey Janssen, DO   ASSESSMENT & PLAN:   Lateral epicondylitis (tennis elbow) Recommend continuing exercises for another 4-6 weeks, gave handout.   Arnica gel 2-3 times per day Modification of activities that aggravate- gripping Follow up in 6 weeks if not improved.

## 2017-05-02 NOTE — Assessment & Plan Note (Signed)
Recommend continuing exercises for another 4-6 weeks, gave handout.   Arnica gel 2-3 times per day Modification of activities that aggravate- gripping Follow up in 6 weeks if not improved.

## 2017-10-06 DIAGNOSIS — S39011A Strain of muscle, fascia and tendon of abdomen, initial encounter: Secondary | ICD-10-CM | POA: Diagnosis not present

## 2017-12-11 DIAGNOSIS — Z0000 Encounter for general adult medical examination without abnormal findings: Secondary | ICD-10-CM | POA: Diagnosis not present

## 2017-12-11 DIAGNOSIS — Z13220 Encounter for screening for lipoid disorders: Secondary | ICD-10-CM | POA: Diagnosis not present

## 2017-12-11 DIAGNOSIS — Z23 Encounter for immunization: Secondary | ICD-10-CM | POA: Diagnosis not present

## 2018-03-12 DIAGNOSIS — Z23 Encounter for immunization: Secondary | ICD-10-CM | POA: Diagnosis not present

## 2018-09-22 ENCOUNTER — Encounter: Payer: Self-pay | Admitting: Sports Medicine

## 2018-09-22 ENCOUNTER — Ambulatory Visit (INDEPENDENT_AMBULATORY_CARE_PROVIDER_SITE_OTHER): Payer: BLUE CROSS/BLUE SHIELD | Admitting: Sports Medicine

## 2018-09-22 VITALS — BP 112/76 | Ht 72.0 in | Wt 178.0 lb

## 2018-09-22 DIAGNOSIS — S46011A Strain of muscle(s) and tendon(s) of the rotator cuff of right shoulder, initial encounter: Secondary | ICD-10-CM

## 2018-09-22 NOTE — Progress Notes (Addendum)
HPI  CC: Right shoulder pain  Nicholas Rhodes is a 57 year old male presents for right shoulder pain.  He states the pain started back in April of this year.  He states he was tried a pop a wheelie on a bicycle, when he fell and landed on his right side.  He states the pain is been present since that time.  He states he has been able to continue to bike, swim, and run.  He is also been able to do overhead workouts without difficulty.  He states the pain is worse when he internally rotates his shoulder reaching up behind him.  He states his daily hurts him when he was trying to reach back behind his car, and when he tried putting a jacket.  He has not tried any medications for relief.  He states the pain is located in the anterior shoulder.  The pain does not radiate any part of his body.  He denies any numbness and tingling.  Denies any weakness of the shoulder.  No prior trauma to the area.  See HPI and/or previous note for associated ROS.  Objective: BP 112/76   Ht 6' (1.829 m)   Wt 178 lb (80.7 kg)   BMI 24.14 kg/m  Gen: Right-Hand Dominant. NAD, well groomed, a/o x3, normal affect.  CV: Well-perfused. Warm.  Resp: Non-labored.  Neuro: Sensation intact throughout. No gross coordination deficits.   Right shoulder exam: No erythema, warmth, swelling noted.  Tenderness to palpation over the anterior shoulder, along the bicipital groove.  Full range of motion for flexion, abduction, internal and external rotation.  Strength 5 out of 5 throughout testing.  Negative Hawkins test, negative Neer's test, negative empty can test, negative speeds test, negative crossover test, positive belly liftoff test.  ULTRASOUND: Shoulder, right Diagnostic complete ultrasound imaging obtained of patient's right shoulder.  - No obvious evidence of bony deformity or osteophyte development appreciated.  - Long head of the biceps tendon: No evidence of tendon thickening, calcification, subluxation, or tearing in short or  long axis views. No edema or bullseye sign.  - Subscapularis tendon: complete visualization across the width of the insertion point yielded evidence of tendon thickening and calcification at the distal attachment in long axis view.  - Supraspinatus tendon: complete visualization across the width of the insertion point yielded no evidence of tendon thickening, calcification, or tears in the long axis view. No evidence of bursal inflammation appreciated.  - Infraspinatus and teres minor tendons: visualization across the width of the insertion points yielded no evidence of tendon thickening, calcification, or tears in the long axis view.  [facility] Joint: No evidence of joint separation, collapse, or osteophyte development appreciated. No effusion present.  IMPRESSION: findings consistent with calcification of the subscapularis, likely from a healing partial tear.  Abnormalities found, note neovessels:  "  Doppler showed no evidence of neovascularization  "  Assessment and plan: Right side subscapularis partial tear, healed by ultrasound today  We discussed treatment options discussed today's visit.  He seems to be doing well in his recovery process.  We gave him some exercises today.  He is to do cogs of the wheel or sizes with 10 reps, 3 times daily.  He is also given some internal rotation exercises at this time.  He is to start at neutral with 3 sets of 15.  He is then to go to 45 degrees and then 90 degrees with 3 sets of 15 in each direction.  We will see  him for follow-up back in 3 months.  If he has any worsening pain in the interim, he should return to clinic to be reevaluated.  Alric Quan, MD [facility] Sports Medicine Fellow 09/22/2018 12:23 PM  I observed and examined the patient with the SMFellow and agree with assessment and plan.  Note reviewed and modified by me. Sterling Big, MD

## 2018-09-22 NOTE — Patient Instructions (Addendum)
Thank you for coming to see [place] today in clinic.  You were diagnosed with a rotator cuff tendinopathy at today's visit.  Provided you with some exercises to work on today.  You were given a handout with [place] instructions on how to perform the exercises.  We will see for follow-up as needed in this clinic.  If you have worsening of pain, or change in symptoms please give [place] a call and reschedule an appointment.

## 2018-12-17 DIAGNOSIS — Z125 Encounter for screening for malignant neoplasm of prostate: Secondary | ICD-10-CM | POA: Diagnosis not present

## 2018-12-17 DIAGNOSIS — Z13220 Encounter for screening for lipoid disorders: Secondary | ICD-10-CM | POA: Diagnosis not present

## 2018-12-17 DIAGNOSIS — Z0000 Encounter for general adult medical examination without abnormal findings: Secondary | ICD-10-CM | POA: Diagnosis not present

## 2018-12-22 DIAGNOSIS — Z0000 Encounter for general adult medical examination without abnormal findings: Secondary | ICD-10-CM | POA: Diagnosis not present

## 2018-12-22 DIAGNOSIS — J45990 Exercise induced bronchospasm: Secondary | ICD-10-CM | POA: Diagnosis not present

## 2018-12-22 DIAGNOSIS — J309 Allergic rhinitis, unspecified: Secondary | ICD-10-CM | POA: Diagnosis not present

## 2019-09-16 DIAGNOSIS — Z23 Encounter for immunization: Secondary | ICD-10-CM | POA: Diagnosis not present

## 2019-12-24 DIAGNOSIS — Z125 Encounter for screening for malignant neoplasm of prostate: Secondary | ICD-10-CM | POA: Diagnosis not present

## 2019-12-24 DIAGNOSIS — Z13220 Encounter for screening for lipoid disorders: Secondary | ICD-10-CM | POA: Diagnosis not present

## 2019-12-24 DIAGNOSIS — Z0000 Encounter for general adult medical examination without abnormal findings: Secondary | ICD-10-CM | POA: Diagnosis not present

## 2019-12-29 DIAGNOSIS — Z0000 Encounter for general adult medical examination without abnormal findings: Secondary | ICD-10-CM | POA: Diagnosis not present

## 2020-02-22 ENCOUNTER — Ambulatory Visit (INDEPENDENT_AMBULATORY_CARE_PROVIDER_SITE_OTHER): Payer: BC Managed Care – PPO | Admitting: Sports Medicine

## 2020-02-22 ENCOUNTER — Other Ambulatory Visit: Payer: Self-pay

## 2020-02-22 VITALS — BP 120/78 | Ht 72.0 in | Wt 182.0 lb

## 2020-02-22 DIAGNOSIS — M21619 Bunion of unspecified foot: Secondary | ICD-10-CM

## 2020-02-22 DIAGNOSIS — S76319A Strain of muscle, fascia and tendon of the posterior muscle group at thigh level, unspecified thigh, initial encounter: Secondary | ICD-10-CM

## 2020-02-22 NOTE — Progress Notes (Signed)
   [facility] [address] [place], [place] [address] Phone: [phone] Fax: [phone]   Patient Name: Nicholas Rhodes Date of Birth: 03/18/1961 Medical Record Number: [id number] Gender: male Date of Encounter: 02/22/2020  SUBJECTIVE:      Chief Complaint:  Bilateral hamstring/gluteal pain   HPI:  Nicholas Rhodes is presenting for bilateral hamstring and gluteal pain.  He is an avid runner and triathlete.  He strained the right hamstring last summer, and the left hamstring about 6 weeks later.  Over the last month, pain has improved, including increasing his mileage.  Aggravating factors include going up hill.  He has noticed pain when he sits.  Denies any radiating pain into his toes.  Is also been crosstraining with cycling.     ROS:     See HPI. Note easy cramping in HS No low back pain No sciatica   PERTINENT  PMH / PSH / FH / SH:  Past Medical, Surgical, Social, and Family History Reviewed & Updated in the EMR. Pertinent findings include:  Metatarsalgia, and great toe bunion, history of left hamstring strain, cavus deformity   OBJECTIVE:  BP 120/78   Ht 6' (1.829 m)   Wt 182 lb (82.6 kg)   BMI 24.68 kg/m  Physical Exam:  Vital signs are reviewed.   GEN: Alert and oriented, NAD Pulm: Breathing unlabored PSY: normal mood, congruent affect  MSK: Bilateral hamstring No swelling or erythema Mild TTP at ischial tuberosity, more lateral ischium on the left Strength 5/5 bilaterally Hip abduction 4+/5 NVI  Right foot Bunion deformity at great toe Fifth metatarsal subluxation with callus  Limited MSK ultrasound Left hamstring Moderate hypoechoic changes at lateral hamstring (BF) and glutes maximus proximal tendon. Right hamstring Increased hyperechoic changes at fascial's sheath between biceps femoris and semitendinosis  Impression: Left hamstring stage II strain with possible hematoma.  Right hamstring - findings of probable  scar tissue from prior injury  ASSESSMENT & PLAN:   1. Bilateral hamstring pain  We fitted patient for bilateral heel lifts in his running shoes today.  We have also instructed him to begin asking exercises.  In regards to the possible hematoma on the left side, recommended he wear his body Helix when running and exercising.  In regards to his foot, he will follow-up for custom orthotics.   [name], DO, ATC Sports Medicine Fellow  I observed and examined the patient with Dr. Ruta Hinds and agree with assessment and plan.  Note reviewed and modified by me. Sterling Big, MD

## 2020-03-14 ENCOUNTER — Encounter: Payer: Self-pay | Admitting: Sports Medicine

## 2020-03-14 ENCOUNTER — Other Ambulatory Visit: Payer: Self-pay

## 2020-03-14 ENCOUNTER — Ambulatory Visit (INDEPENDENT_AMBULATORY_CARE_PROVIDER_SITE_OTHER): Payer: BC Managed Care – PPO | Admitting: Sports Medicine

## 2020-03-14 VITALS — BP 108/72 | Ht 72.0 in | Wt 182.0 lb

## 2020-03-14 DIAGNOSIS — M216X9 Other acquired deformities of unspecified foot: Secondary | ICD-10-CM | POA: Diagnosis not present

## 2020-03-14 DIAGNOSIS — S76319A Strain of muscle, fascia and tendon of the posterior muscle group at thigh level, unspecified thigh, initial encounter: Secondary | ICD-10-CM | POA: Diagnosis not present

## 2020-03-14 NOTE — Progress Notes (Signed)
   [facility] [address] [place], [place] [address] Phone: [phone] Fax: [phone]   Patient Name: Nicholas Rhodes Date of Birth: 06-07-1961 Medical Record Number: [id number] Gender: male Date of Encounter: 03/14/2020  SUBJECTIVE:      Chief Complaint: Bilateral hamstring strain and custom orthotics   HPI:  Nicholas Rhodes is following up for bilateral hamstring pain.  He has been diligently doing the exercises and continues to run.  His left hamstring is feeling much better than before.  He is still having difficulty with hills on the right hamstring.  He has been foam rolling regularly.  Denies any numbness or tingling into his toes.  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  We gave him heel lifts and scaphoid pads at his last visit a few weeks ago.   ROS:     See HPI.   PERTINENT  PMH / PSH / FH / SH:  Past Medical, Surgical, Social, and Family History Reviewed & Updated in the EMR.    OBJECTIVE:  BP 108/72   Ht 6' (1.829 m)   Wt 182 lb (82.6 kg)   BMI 24.68 kg/m  Physical Exam:  Vital signs are reviewed.   GEN: Alert and oriented, NAD Pulm: Breathing unlabored PSY: normal mood, congruent affect  MSK: Bilateral hamstring No swelling or erythema Mild TTP at right ischial tuberosity Strength 5/5 bilaterally, lower right side is mildly weaker Hip abduction 4+/5 NVI  Patient was fitted for a standard, cushioned, semi-rigid orthotic.  The orthotic was heated and the patient stood on the orthotic blank positioned on the orthotic stand. The patient was positioned in subtalar neutral position and 10 degrees of ankle dorsiflexion in a weight bearing stance. After molding, a stable [employer] EVA base was applied to the orthotic blank.   The blank was ground to a stable position for weight bearing. Size: 11 base: Blue EVA posting: none additional orthotic  padding: Bilateral scaphoid pads  Patient understood RTC needs and will follow up prn  ASSESSMENT & PLAN:   1. Bilateral hamstring strain  Overall, patient is about halfway through rehabilitation.  We recommended he continue with the rehab exercises.  Continue foam rolling and consider massage to the right hamstring.  Avoid hills as best as possible.   [name], DO, ATC Sports Medicine Fellow  I observed and examined the patient with Dr. Ruta Hinds and agree with assessment and plan.  Note reviewed and modified by me. Sterling Big, MD

## 2020-04-18 ENCOUNTER — Ambulatory Visit: Payer: BC Managed Care – PPO | Admitting: Sports Medicine

## 2020-05-23 ENCOUNTER — Ambulatory Visit: Payer: BC Managed Care – PPO | Admitting: Sports Medicine

## 2020-08-01 DIAGNOSIS — M2021 Hallux rigidus, right foot: Secondary | ICD-10-CM | POA: Diagnosis not present

## 2020-08-01 DIAGNOSIS — M7661 Achilles tendinitis, right leg: Secondary | ICD-10-CM | POA: Diagnosis not present

## 2020-08-02 DIAGNOSIS — Z20822 Contact with and (suspected) exposure to covid-19: Secondary | ICD-10-CM | POA: Diagnosis not present

## 2020-09-20 DIAGNOSIS — Z20822 Contact with and (suspected) exposure to covid-19: Secondary | ICD-10-CM | POA: Diagnosis not present

## 2020-10-26 DIAGNOSIS — M7661 Achilles tendinitis, right leg: Secondary | ICD-10-CM | POA: Diagnosis not present

## 2020-12-19 DIAGNOSIS — H53143 Visual discomfort, bilateral: Secondary | ICD-10-CM | POA: Diagnosis not present

## 2020-12-19 DIAGNOSIS — H524 Presbyopia: Secondary | ICD-10-CM | POA: Diagnosis not present

## 2020-12-19 DIAGNOSIS — D3132 Benign neoplasm of left choroid: Secondary | ICD-10-CM | POA: Diagnosis not present

## 2020-12-29 DIAGNOSIS — R718 Other abnormality of red blood cells: Secondary | ICD-10-CM | POA: Diagnosis not present

## 2020-12-29 DIAGNOSIS — Z125 Encounter for screening for malignant neoplasm of prostate: Secondary | ICD-10-CM | POA: Diagnosis not present

## 2020-12-29 DIAGNOSIS — Z0000 Encounter for general adult medical examination without abnormal findings: Secondary | ICD-10-CM | POA: Diagnosis not present

## 2020-12-29 DIAGNOSIS — Z13220 Encounter for screening for lipoid disorders: Secondary | ICD-10-CM | POA: Diagnosis not present

## 2021-01-03 DIAGNOSIS — Z0000 Encounter for general adult medical examination without abnormal findings: Secondary | ICD-10-CM | POA: Diagnosis not present

## 2022-01-07 DIAGNOSIS — Z125 Encounter for screening for malignant neoplasm of prostate: Secondary | ICD-10-CM | POA: Diagnosis not present

## 2022-01-07 DIAGNOSIS — Z13220 Encounter for screening for lipoid disorders: Secondary | ICD-10-CM | POA: Diagnosis not present

## 2022-01-07 DIAGNOSIS — R718 Other abnormality of red blood cells: Secondary | ICD-10-CM | POA: Diagnosis not present

## 2022-01-09 DIAGNOSIS — Z0000 Encounter for general adult medical examination without abnormal findings: Secondary | ICD-10-CM | POA: Diagnosis not present

## 2022-08-08 ENCOUNTER — Ambulatory Visit (INDEPENDENT_AMBULATORY_CARE_PROVIDER_SITE_OTHER): Payer: BC Managed Care – PPO | Admitting: Sports Medicine

## 2022-08-08 VITALS — BP 109/60 | Ht 72.0 in | Wt 174.0 lb

## 2022-08-08 DIAGNOSIS — M21611 Bunion of right foot: Secondary | ICD-10-CM | POA: Diagnosis not present

## 2022-08-08 NOTE — Progress Notes (Signed)
  Nicholas Rhodes - 61 y.o. male MRN [id number]  Date of birth: 03/09/1961    CHIEF COMPLAINT:   R foot bunion    SUBJECTIVE:   HPI:  Pleasant 60yo triathlete here for evaluation of R foot bunion. Bunion has been present for over 1 year now. Usually does not bother him because he takes great care to wear shoes with wide toe boxes. He also wears inserts with metatarsal pad.   About 2 weeks ago he did a long cycling race with narrow shoes. This irritated the bunion more than usual. HE started wearing a bunion sock and switched his riding shoes which improved his symptoms significantly.   Today he wants to have his shoes evaluated and make sure he is doing all he can to protect his bunion during his upcoming trip to [place].   ROS:     See HPI  PERTINENT  PMH / PSH FH / / SH:  Past Medical, Surgical, Social, and Family History Reviewed & Updated in the EMR.  Pertinent findings include:  none  OBJECTIVE: BP 109/60   Ht 6' (1.829 m)   Wt 174 lb (78.9 kg)   BMI 23.60 kg/m   Physical Exam:  Vital signs are reviewed.  GEN: Alert and oriented, NAD Pulm: Breathing unlabored PSY: normal mood, congruent affect  MSK: R Foot - bunion present at 1st MTP. There is overlying callous at bunion and callous at the medial aspect of the IP joint as well. No erythema. Nontender to palpation. Full ROM and good strength in great toe. Mild loss of longitudinal arch, mild loss of transverse arch with some splaying of 2nd and 3rd toes and pronation of 4th and 5th toes.   ASSESSMENT & PLAN:  1. Bunion of Right Foot Great Toe  - Patient was reassured about his bunion. No need for surgical intervention. Continue wide toe shoes. He was given a pair of green sport insoles with a metatarsal cookie in the R orthotic to support and eliminate pressure across the metatarsal region. He can follow up as needed for this. If no improvement, I would recommend custom orthotics with a first ray post to try to  alleviate more pressure from first MTP.   Arvella Nigh, MD PGY-4, Sports Medicine Fellow [facility]  I observed and examined the patient with the resident and agree with assessment and plan.  Note reviewed and modified by me. Sterling Big, MD

## 2023-01-10 DIAGNOSIS — Z0000 Encounter for general adult medical examination without abnormal findings: Secondary | ICD-10-CM | POA: Diagnosis not present

## 2023-01-10 DIAGNOSIS — Z13220 Encounter for screening for lipoid disorders: Secondary | ICD-10-CM | POA: Diagnosis not present

## 2023-01-10 DIAGNOSIS — Z125 Encounter for screening for malignant neoplasm of prostate: Secondary | ICD-10-CM | POA: Diagnosis not present

## 2023-01-15 DIAGNOSIS — Z0000 Encounter for general adult medical examination without abnormal findings: Secondary | ICD-10-CM | POA: Diagnosis not present

## 2023-01-15 DIAGNOSIS — J45990 Exercise induced bronchospasm: Secondary | ICD-10-CM | POA: Diagnosis not present

## 2023-01-15 DIAGNOSIS — Z23 Encounter for immunization: Secondary | ICD-10-CM | POA: Diagnosis not present

## 2023-01-15 DIAGNOSIS — J309 Allergic rhinitis, unspecified: Secondary | ICD-10-CM | POA: Diagnosis not present

## 2023-07-14 DIAGNOSIS — Z125 Encounter for screening for malignant neoplasm of prostate: Secondary | ICD-10-CM | POA: Diagnosis not present

## 2023-09-02 DIAGNOSIS — M542 Cervicalgia: Secondary | ICD-10-CM | POA: Diagnosis not present

## 2023-09-02 DIAGNOSIS — M9901 Segmental and somatic dysfunction of cervical region: Secondary | ICD-10-CM | POA: Diagnosis not present

## 2023-09-02 DIAGNOSIS — M9903 Segmental and somatic dysfunction of lumbar region: Secondary | ICD-10-CM | POA: Diagnosis not present

## 2023-09-02 DIAGNOSIS — M9902 Segmental and somatic dysfunction of thoracic region: Secondary | ICD-10-CM | POA: Diagnosis not present

## 2023-09-05 DIAGNOSIS — M542 Cervicalgia: Secondary | ICD-10-CM | POA: Diagnosis not present

## 2023-09-05 DIAGNOSIS — M9906 Segmental and somatic dysfunction of lower extremity: Secondary | ICD-10-CM | POA: Diagnosis not present

## 2023-09-05 DIAGNOSIS — M9902 Segmental and somatic dysfunction of thoracic region: Secondary | ICD-10-CM | POA: Diagnosis not present

## 2023-09-05 DIAGNOSIS — M9903 Segmental and somatic dysfunction of lumbar region: Secondary | ICD-10-CM | POA: Diagnosis not present

## 2023-09-05 DIAGNOSIS — M9901 Segmental and somatic dysfunction of cervical region: Secondary | ICD-10-CM | POA: Diagnosis not present

## 2023-09-10 DIAGNOSIS — M9901 Segmental and somatic dysfunction of cervical region: Secondary | ICD-10-CM | POA: Diagnosis not present

## 2023-09-10 DIAGNOSIS — M542 Cervicalgia: Secondary | ICD-10-CM | POA: Diagnosis not present

## 2023-09-10 DIAGNOSIS — M9903 Segmental and somatic dysfunction of lumbar region: Secondary | ICD-10-CM | POA: Diagnosis not present

## 2023-09-10 DIAGNOSIS — M9902 Segmental and somatic dysfunction of thoracic region: Secondary | ICD-10-CM | POA: Diagnosis not present

## 2023-09-10 DIAGNOSIS — M9906 Segmental and somatic dysfunction of lower extremity: Secondary | ICD-10-CM | POA: Diagnosis not present

## 2023-10-07 ENCOUNTER — Encounter: Payer: Self-pay | Admitting: Sports Medicine

## 2023-10-07 ENCOUNTER — Ambulatory Visit (INDEPENDENT_AMBULATORY_CARE_PROVIDER_SITE_OTHER): Payer: BC Managed Care – PPO | Admitting: Sports Medicine

## 2023-10-07 ENCOUNTER — Other Ambulatory Visit: Payer: Self-pay

## 2023-10-07 VITALS — BP 126/80 | Ht 72.0 in | Wt 179.0 lb

## 2023-10-07 DIAGNOSIS — M25572 Pain in left ankle and joints of left foot: Secondary | ICD-10-CM

## 2023-10-07 DIAGNOSIS — M76822 Posterior tibial tendinitis, left leg: Secondary | ICD-10-CM | POA: Insufficient documentation

## 2023-10-07 NOTE — Assessment & Plan Note (Addendum)
-   Ultrasound findings consistent.  No concern for stress fracture and x-ray not indicated - We discussed the use of NSAIDs for 8-10 days, ice massage, and strengthening including intoed toe raises and toe walking - Scaphoid pad placed in insoles for arch support - Slowly ramp up running length 3 times weekly. If pain and swelling persists with longer runs we can try a compression sleeve.  -Will follow-up as needed

## 2023-10-07 NOTE — Progress Notes (Signed)
  Nicholas Rhodes - 62 y.o. male MRN [id number]  Date of birth: [birth year]  PCP: Elias Else, MD (Inactive)  Subjective:  No chief complaint on file. Left ankle swelling and pain  HPI: Past Medical, Surgical, Social, and Family History Reviewed & Updated per EMR.   Patient is a 62 y.o. male here for pain and swelling located over the posterior medial ankle that started on mile 8-12 of the [place] qualifier for which he had to stop running. It is exacerbated by increased length of running and alleviated by rest and decreasing length to 3-4 miles of running 3 times a week.  The patient can jump and run shorter distances without any pain which is very different from his previous stress fractures.  He has not had any distal numbness or weakness.   History reviewed. No pertinent surgical history.  No Known Allergies      Objective:  Physical Exam: VS: BP:126/80  HR: bpm  TEMP: ( )  RESP:   HT:6' (182.9 cm)   WT:179 lb (81.2 kg)  BMI:24.27  Gen: NAD, speaks clearly, comfortable in exam room Respiratory: normal work of breathing on room air Skin: No rashes, abrasions, or ecchymosis MSK:  Left ankle: No visible erythema or swelling. Range of motion is full in all directions. Strength is 5/5 in all directions and no pain elicited with resistance Anterior drawer negative, Talar tilt negative Squeeze test negative Talar dome nontender Tenderness to palpation over the posterior tibialis No sign of peroneal tendon subluxations or tenderness to palpation No sensory deficits Dorsalis pedis pulse strong, Posterior tibial pulse normal  Limited ultrasound of the left ankle:  - The talar dome was visualized in the SAX and LAX. No hypoechoic changes in the articular surface suggestive of fracture. No calcifications.  - The posterior edge of the medial malleolus is visualized w/out disruption of the ostium.  - Posterior tibialis, Flexor digitorum longus, and flexor hallucis longus  visualized in SAX and followed distally until FDL and FHL dive inferiorly.  Hypoechoic changes surrounding the tendon of the posterior tibialis 2 cm proximal to the malleolus extending down to the distal edge of the malleolus.. Posterior tibial artery and nerve visualized in SAX and no abnormalities.   Summary: Findings suggestive of posterior tibialis tendinitis  Ultrasound and interpretation by Dr. Webb Silversmith and Dr. Darrick Penna       Assessment & Plan:   Left tibialis posterior tendinitis - Ultrasound findings consistent.  No concern for stress fracture and x-ray not indicated - We discussed the use of NSAIDs for 8-10 days, ice massage, and strengthening including intoed toe raises and toe walking - Scaphoid pad placed in insoles for arch support - Slowly ramp up running length 3 times weekly. If pain and swelling persists with longer runs we can try a compression sleeve.  -Will follow-up as needed    Rica Mote MD [facility] Sports Medicine Fellow  I observed and examined the patient with the [facility] resident and agree with assessment and plan.  Note reviewed and modified by me. Sterling Big, MD

## 2023-10-17 DIAGNOSIS — M9906 Segmental and somatic dysfunction of lower extremity: Secondary | ICD-10-CM | POA: Diagnosis not present

## 2023-10-17 DIAGNOSIS — M9903 Segmental and somatic dysfunction of lumbar region: Secondary | ICD-10-CM | POA: Diagnosis not present

## 2023-10-17 DIAGNOSIS — M9901 Segmental and somatic dysfunction of cervical region: Secondary | ICD-10-CM | POA: Diagnosis not present

## 2023-10-17 DIAGNOSIS — M9902 Segmental and somatic dysfunction of thoracic region: Secondary | ICD-10-CM | POA: Diagnosis not present

## 2023-10-17 DIAGNOSIS — M542 Cervicalgia: Secondary | ICD-10-CM | POA: Diagnosis not present

## 2023-11-02 DIAGNOSIS — L570 Actinic keratosis: Secondary | ICD-10-CM | POA: Diagnosis not present

## 2023-11-02 DIAGNOSIS — L089 Local infection of the skin and subcutaneous tissue, unspecified: Secondary | ICD-10-CM | POA: Diagnosis not present

## 2023-11-03 DIAGNOSIS — R3129 Other microscopic hematuria: Secondary | ICD-10-CM | POA: Diagnosis not present

## 2023-11-03 DIAGNOSIS — R109 Unspecified abdominal pain: Secondary | ICD-10-CM | POA: Diagnosis not present

## 2024-01-12 DIAGNOSIS — L821 Other seborrheic keratosis: Secondary | ICD-10-CM | POA: Diagnosis not present

## 2024-01-12 DIAGNOSIS — D225 Melanocytic nevi of trunk: Secondary | ICD-10-CM | POA: Diagnosis not present

## 2024-01-12 DIAGNOSIS — L738 Other specified follicular disorders: Secondary | ICD-10-CM | POA: Diagnosis not present

## 2024-01-12 DIAGNOSIS — L812 Freckles: Secondary | ICD-10-CM | POA: Diagnosis not present

## 2024-01-21 DIAGNOSIS — Z13220 Encounter for screening for lipoid disorders: Secondary | ICD-10-CM | POA: Diagnosis not present

## 2024-01-21 DIAGNOSIS — Z0000 Encounter for general adult medical examination without abnormal findings: Secondary | ICD-10-CM | POA: Diagnosis not present

## 2024-01-21 DIAGNOSIS — Z125 Encounter for screening for malignant neoplasm of prostate: Secondary | ICD-10-CM | POA: Diagnosis not present

## 2024-01-22 DIAGNOSIS — Z125 Encounter for screening for malignant neoplasm of prostate: Secondary | ICD-10-CM | POA: Diagnosis not present

## 2024-01-22 DIAGNOSIS — J309 Allergic rhinitis, unspecified: Secondary | ICD-10-CM | POA: Diagnosis not present

## 2024-01-22 DIAGNOSIS — Z13220 Encounter for screening for lipoid disorders: Secondary | ICD-10-CM | POA: Diagnosis not present

## 2024-01-22 DIAGNOSIS — Z0000 Encounter for general adult medical examination without abnormal findings: Secondary | ICD-10-CM | POA: Diagnosis not present

## 2024-03-04 DIAGNOSIS — Z23 Encounter for immunization: Secondary | ICD-10-CM | POA: Diagnosis not present

## 2024-09-17 DIAGNOSIS — Z23 Encounter for immunization: Secondary | ICD-10-CM | POA: Diagnosis not present
